# Patient Record
Sex: Male | Born: 1990 | Race: White | Hispanic: No | Marital: Married | Smoking: Never smoker
Health system: Southern US, Community
[De-identification: ages and names within clinical notes are randomized; demographics above are authoritative.]

## PROBLEM LIST (undated history)

## (undated) DIAGNOSIS — Z8619 Personal history of other infectious and parasitic diseases: Secondary | ICD-10-CM

## (undated) DIAGNOSIS — N200 Calculus of kidney: Secondary | ICD-10-CM

## (undated) HISTORY — DX: Personal history of other infectious and parasitic diseases: Z86.19

## (undated) HISTORY — PX: SMALL INTESTINE SURGERY: SHX150

---
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
## 2010-10-07 LAB — URINALYSIS, ROUTINE W REFLEX MICROSCOPIC
Bilirubin Urine: NEGATIVE
Glucose, UA: NEGATIVE mg/dL
Ketones, ur: NEGATIVE mg/dL
Leukocytes, UA: NEGATIVE
Nitrite: NEGATIVE
Protein, ur: NEGATIVE mg/dL
Specific Gravity, Urine: 1.03 (ref 1.005–1.030)
Urobilinogen, UA: 0.2 mg/dL (ref 0.0–1.0)
pH: 5.5 (ref 5.0–8.0)

## 2010-10-07 LAB — URINE MICROSCOPIC-ADD ON

## 2014-07-28 ENCOUNTER — Emergency Department (HOSPITAL_COMMUNITY)
Admission: EM | Admit: 2014-07-28 | Discharge: 2014-07-28 | Disposition: A | Discharge status: Home / Self Care | Payer: BLUE CROSS/BLUE SHIELD | Attending: Emergency Medicine | Admitting: Emergency Medicine

## 2014-07-28 ENCOUNTER — Encounter (HOSPITAL_COMMUNITY): Payer: Self-pay | Admitting: Emergency Medicine

## 2014-07-28 ENCOUNTER — Emergency Department (HOSPITAL_COMMUNITY): Payer: BLUE CROSS/BLUE SHIELD

## 2014-07-28 DIAGNOSIS — Z87442 Personal history of urinary calculi: Secondary | ICD-10-CM | POA: Insufficient documentation

## 2014-07-28 DIAGNOSIS — N23 Unspecified renal colic: Secondary | ICD-10-CM

## 2014-07-28 DIAGNOSIS — R1031 Right lower quadrant pain: Secondary | ICD-10-CM | POA: Diagnosis present

## 2014-07-28 DIAGNOSIS — R109 Unspecified abdominal pain: Secondary | ICD-10-CM

## 2014-07-28 HISTORY — DX: Calculus of kidney: N20.0

## 2014-07-28 LAB — URINALYSIS, ROUTINE W REFLEX MICROSCOPIC
Bilirubin Urine: NEGATIVE
Glucose, UA: NEGATIVE mg/dL
Ketones, ur: NEGATIVE mg/dL
Leukocytes, UA: NEGATIVE
Nitrite: NEGATIVE
Protein, ur: NEGATIVE mg/dL
Specific Gravity, Urine: 1.024 (ref 1.005–1.030)
Urobilinogen, UA: 0.2 mg/dL (ref 0.0–1.0)
pH: 5.5 (ref 5.0–8.0)

## 2014-07-28 LAB — CBC WITH DIFFERENTIAL/PLATELET
Basophils Absolute: 0.1 10*3/uL (ref 0.0–0.1)
Basophils Relative: 1 % (ref 0–1)
Eosinophils Absolute: 0.3 10*3/uL (ref 0.0–0.7)
Eosinophils Relative: 2 % (ref 0–5)
HCT: 44.9 % (ref 39.0–52.0)
Hemoglobin: 15.3 g/dL (ref 13.0–17.0)
Lymphocytes Relative: 37 % (ref 12–46)
Lymphs Abs: 4.1 10*3/uL — ABNORMAL HIGH (ref 0.7–4.0)
MCH: 28.1 pg (ref 26.0–34.0)
MCHC: 34.1 g/dL (ref 30.0–36.0)
MCV: 82.5 fL (ref 78.0–100.0)
Monocytes Absolute: 0.8 10*3/uL (ref 0.1–1.0)
Monocytes Relative: 7 % (ref 3–12)
Neutro Abs: 5.9 10*3/uL (ref 1.7–7.7)
Neutrophils Relative %: 53 % (ref 43–77)
Platelets: 284 10*3/uL (ref 150–400)
RBC: 5.44 MIL/uL (ref 4.22–5.81)
RDW: 13.1 % (ref 11.5–15.5)
WBC: 11.2 10*3/uL — ABNORMAL HIGH (ref 4.0–10.5)

## 2014-07-28 LAB — COMPREHENSIVE METABOLIC PANEL
ALT: 43 U/L (ref 0–53)
AST: 29 U/L (ref 0–37)
Albumin: 4.3 g/dL (ref 3.5–5.2)
Alkaline Phosphatase: 77 U/L (ref 39–117)
Anion gap: 13 (ref 5–15)
BUN: 10 mg/dL (ref 6–23)
CO2: 20 mmol/L (ref 19–32)
Calcium: 9.3 mg/dL (ref 8.4–10.5)
Chloride: 104 mEq/L (ref 96–112)
Creatinine, Ser: 0.99 mg/dL (ref 0.50–1.35)
GFR calc Af Amer: >90 mL/min (ref >90)
GFR calc non Af Amer: >90 mL/min (ref >90)
Glucose, Bld: 104 mg/dL — ABNORMAL HIGH (ref 70–99)
Potassium: 4 mmol/L (ref 3.5–5.1)
Sodium: 137 mmol/L (ref 135–145)
Total Bilirubin: 0.8 mg/dL (ref 0.3–1.2)
Total Protein: 7.1 g/dL (ref 6.0–8.3)

## 2014-07-28 LAB — URINE MICROSCOPIC-ADD ON

## 2014-07-28 LAB — LIPASE, BLOOD: Lipase: 39 U/L (ref 11–59)

## 2014-07-28 MED ORDER — OXYCODONE-ACETAMINOPHEN 5-325 MG PO TABS
2.0000 | ORAL_TABLET | Freq: Once | ORAL | Status: AC
  Administered 2014-07-28: 2 via ORAL
  Filled 2014-07-28: qty 2

## 2014-07-28 MED ORDER — ONDANSETRON HCL 4 MG/2ML IJ SOLN
4.0000 mg | Freq: Once | INTRAMUSCULAR | Status: AC
  Administered 2014-07-28: 4 mg via INTRAVENOUS
  Filled 2014-07-28: qty 2

## 2014-07-28 MED ORDER — TAMSULOSIN HCL 0.4 MG PO CAPS: 0.4000 mg | ORAL_CAPSULE | Freq: Every day | ORAL | Status: DC

## 2014-07-28 MED ORDER — MORPHINE SULFATE 4 MG/ML IJ SOLN
4.0000 mg | Freq: Once | INTRAMUSCULAR | Status: AC
  Administered 2014-07-28: 4 mg via INTRAVENOUS
  Filled 2014-07-28: qty 1

## 2014-07-28 MED ORDER — KETOROLAC TROMETHAMINE 30 MG/ML IJ SOLN
30.0000 mg | Freq: Once | INTRAMUSCULAR | Status: AC
  Administered 2014-07-28: 30 mg via INTRAVENOUS
  Filled 2014-07-28: qty 1

## 2014-07-28 MED ORDER — ONDANSETRON 4 MG PO TBDP: ORAL_TABLET | ORAL | Status: DC

## 2014-07-28 MED ORDER — SODIUM CHLORIDE 0.9 % IV BOLUS (SEPSIS)
1000.0000 mL | Freq: Once | INTRAVENOUS | Status: AC
  Administered 2014-07-28: 1000 mL via INTRAVENOUS

## 2014-07-28 MED ORDER — OXYCODONE-ACETAMINOPHEN 5-325 MG PO TABS: 1.0000 | ORAL_TABLET | Freq: Four times a day (QID) | ORAL | Status: DC | PRN

## 2014-07-28 NOTE — ED Notes (Signed)
Dr. Yao at the bedside. 

## 2014-07-28 NOTE — Discharge Instructions (Signed)
Take motrin 800 mg every 6 hrs for pain.   Take percocet for severe pain. DO NOT drive with it.   Take flomax to help you urinate the stone.   Stay hydrated.   Follow up with urology in a week if you still have pain.   Return to ER if you have severe pain, vomiting, fever.

## 2014-07-28 NOTE — ED Provider Notes (Signed)
CSN: [id number]     Arrival date & time 07/28/14  0413 History   First MD Initiated Contact with Patient 07/28/14 0413     Chief Complaint  Patient presents with  . Flank Pain     (Consider location/radiation/quality/duration/timing/severity/associated sxs/prior Treatment) The history is provided by the patient.  Andrew Ray is a 24 y.o. male hx of kidney stone here with right flank pain, RLQ pain. Woke up this morning with severe right flank pain that is sharp, radiated to RLQ. Denies nausea or vomiting. Didn't take any medications prior to arrival. He said that its similar to previous stone in 2012. Didn't require any surgical interventions for the kidney stone.    Past Medical History  Diagnosis Date  . Kidney stone    History reviewed. No pertinent past surgical history. History reviewed. No pertinent family history. History  Substance Use Topics  . Smoking status: Never Smoker   . Smokeless tobacco: Not on file  . Alcohol Use: No    Review of Systems  Genitourinary: Positive for flank pain.  All other systems reviewed and are negative.     Allergies  Review of patient's allergies indicates no known allergies.  Home Medications   Prior to Admission medications   Not on File   BP 111/66 mmHg  Pulse 74  Temp(Src) 98 F (36.7 C) (Oral)  Resp 16  Ht 5\' 8"  (1.727 m)  Wt 230 lb (104.327 kg)  BMI 34.98 kg/m2  SpO2 99% Physical Exam  Constitutional: He is oriented to person, place, and time.  Uncomfortable   HENT:  Head: Normocephalic.  Mouth/Throat: Oropharynx is clear and moist.  Eyes: Conjunctivae and EOM are normal. Pupils are equal, round, and reactive to light.  Neck: Normal range of motion. Neck supple.  Cardiovascular: Normal rate, regular rhythm and normal heart sounds.   Pulmonary/Chest: Effort normal and breath sounds normal. No respiratory distress. He has no wheezes. He has no rales.  Abdominal: Soft. Bowel sounds are normal. He  exhibits no distension. There is no tenderness. There is no rebound.  Mild R CVAT   Musculoskeletal: Normal range of motion. He exhibits no edema or tenderness.  Neurological: He is alert and oriented to person, place, and time. No cranial nerve deficit. Coordination normal.  Skin: Skin is warm and dry.  Psychiatric: He has a normal mood and affect. His behavior is normal. Judgment and thought content normal.  Nursing note and vitals reviewed.   ED Course  Procedures (including critical care time) Labs Review Labs Reviewed  CBC WITH DIFFERENTIAL - Abnormal; Notable for the following:    WBC 11.2 (*)    Lymphs Abs 4.1 (*)    All other components within normal limits  COMPREHENSIVE METABOLIC PANEL - Abnormal; Notable for the following:    Glucose, Bld 104 (*)    All other components within normal limits  URINALYSIS, ROUTINE W REFLEX MICROSCOPIC - Abnormal; Notable for the following:    APPearance CLOUDY (*)    Hgb urine dipstick LARGE (*)    All other components within normal limits  URINE MICROSCOPIC-ADD ON - Abnormal; Notable for the following:    Bacteria, UA MANY (*)    All other components within normal limits  URINE CULTURE  LIPASE, BLOOD    Imaging Review Ct Renal Stone Study  07/28/2014   CLINICAL DATA:  RIGHT flank and RIGHT lower quadrant pain waking patient from sleep at morning.  EXAM: CT ABDOMEN AND PELVIS WITHOUT CONTRAST  TECHNIQUE:  Multidetector CT imaging of the abdomen and pelvis was performed following the standard protocol without IV contrast.  COMPARISON:  CT of the abdomen and pelvis Dec 08, 2009  FINDINGS: LUNG BASES: Included view of the lung bases are clear. The visualized heart and pericardium are unremarkable.  KIDNEYS/BLADDER: Kidneys are orthotopic, demonstrating normal size and morphology. Mild RIGHT hydroureteronephrosis to the level of the mid ureter where a 3 mm calculus is seen. No residual RIGHT nephrolithiasis. Punctate LEFT lower pole nonobstructing  nephrolithiasis. Limited assessment for renal masses on this nonenhanced examination. The unopacified ureters are normal in course and caliber. Urinary bladder is decompressed and unremarkable.  SOLID ORGANS: The liver, spleen, gallbladder, pancreas and adrenal glands are unremarkable for this non-contrast examination.  GASTROINTESTINAL TRACT: The stomach, small and large bowel are normal in course and caliber without inflammatory changes, the sensitivity may be decreased by lack of enteric contrast. Small amount of small bowel feces can be seen with chronic stasis. The appendix is not discretely identified, however there are no inflammatory changes in the right lower quadrant.  PERITONEUM/RETROPERITONEUM: No intraperitoneal free fluid nor free air. Aortoiliac vessels are normal in course and caliber. No lymphadenopathy by CT size criteria. Internal reproductive organs are unremarkable.  SOFT TISSUES/ OSSEOUS STRUCTURES: Nonsuspicious. Small fat containing inguinal hernias.  IMPRESSION: Mild RIGHT hydroureteronephrosis to the level of the mid ureter where a 3 mm calculus is seen. No residual RIGHT nephrolithiasis.  Punctate nonobstructing LEFT lower pole nephrolithiasis.   Electronically Signed   By: Awilda Metro   On: 07/28/2014 05:08     EKG Interpretation None      MDM   Final diagnoses:  Right flank pain    Andrew Ray is a 24 y.o. male here with R flank pain. Likely renal colic. Will get labs, UA, CT ab/pel.   6:34 AM CT showed 3 mm mid ureteral stone with hydro. UA + blood. No urinary symptoms so urine culture sent. Pain controlled with pain meds. Will dc home with percocet, zofran, flomax. Will have him f/u with urology.     Richardean Canal, MD 07/28/14 [phone]

## 2014-07-28 NOTE — ED Notes (Signed)
Patient reports that he woke up this morning with severe right lower abdominal pain, sharp. Hx of kidney stones approx. 2-3 years ago.  No pain meds prior to arrival, no nausea or vomiting.

## 2014-07-29 LAB — URINE CULTURE: Colony Count: 1000

## 2016-06-24 IMAGING — CT CT RENAL STONE PROTOCOL
2 of 5 series · 8 of 46 positions shown, 9 images · non-contrast
Comparison: CT of the abdomen and pelvis December 08, 2009

CLINICAL DATA: RIGHT flank and RIGHT lower quadrant pain waking
patient from sleep at morning.

EXAM:
CT ABDOMEN AND PELVIS WITHOUT CONTRAST
TECHNIQUE: Multidetector CT imaging of the abdomen and pelvis was performed
following the standard protocol without IV contrast.

[Series 204: coronals · coronal · 0.50mm/px · 3 of 110 slices shown]
[im 37/110  soft-tissue]
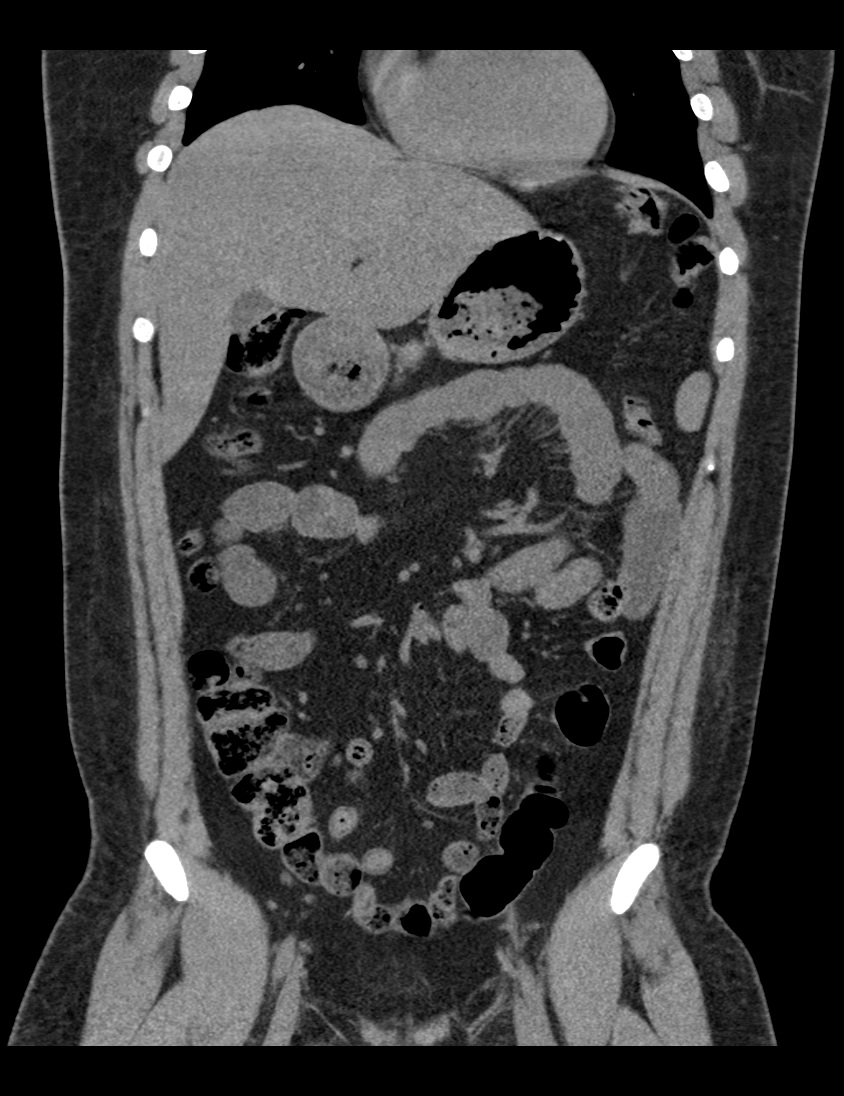
[im 49/110  soft-tissue]
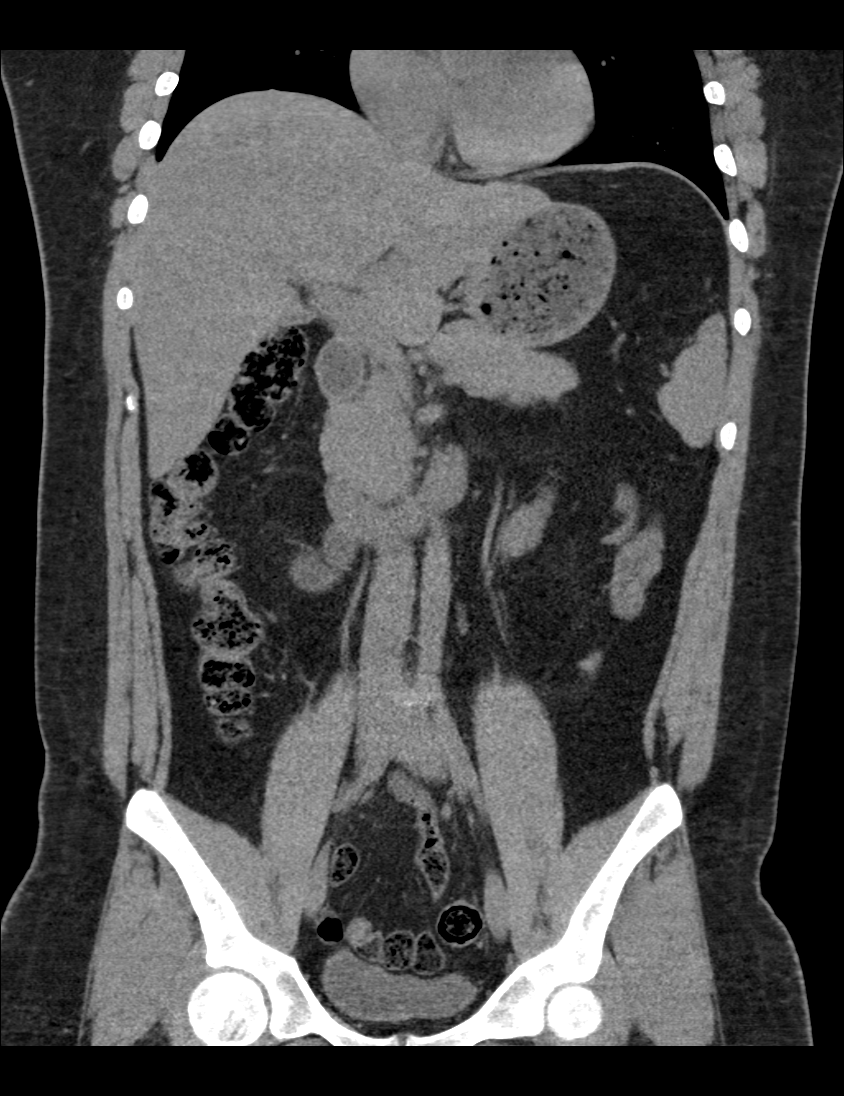
[im 61/110  soft-tissue]
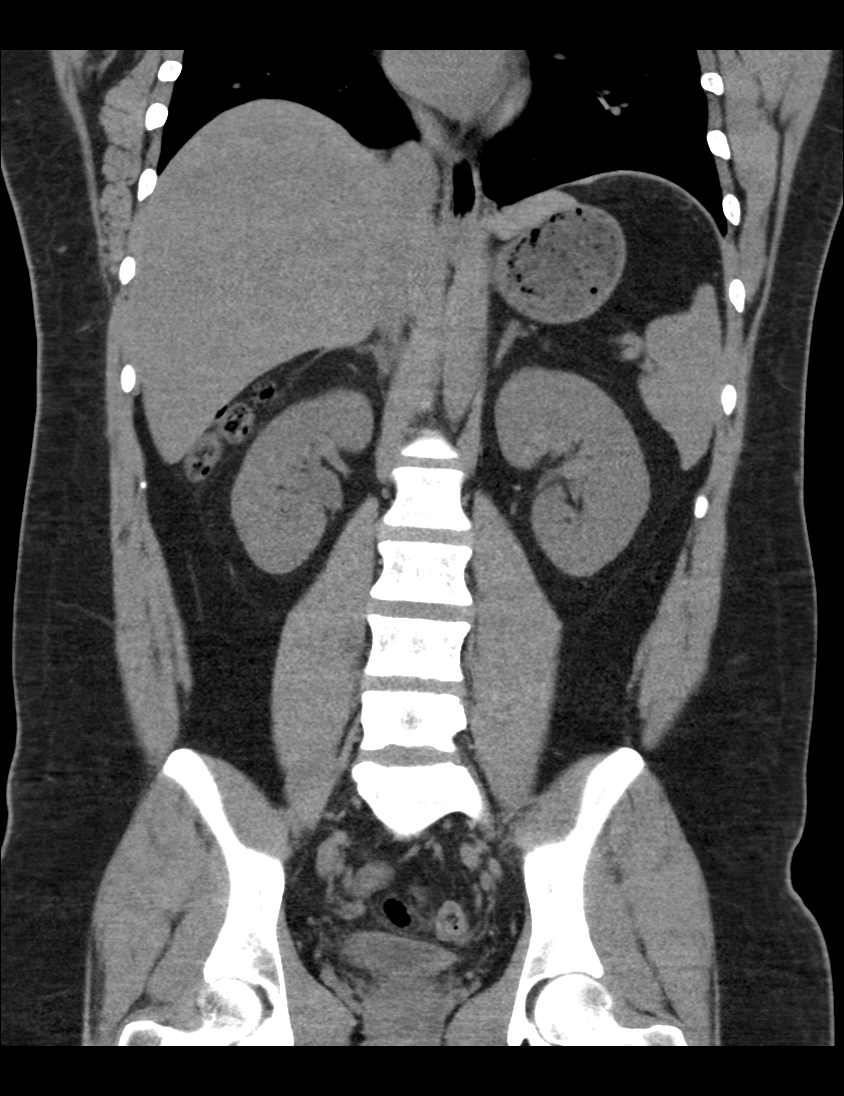

[Series 207: lungs · axial · 0.82mm/px · z∈[+321,+426]mm · 5 of 33 slices shown, 6 images]
[im 6/33  soft-tissue]
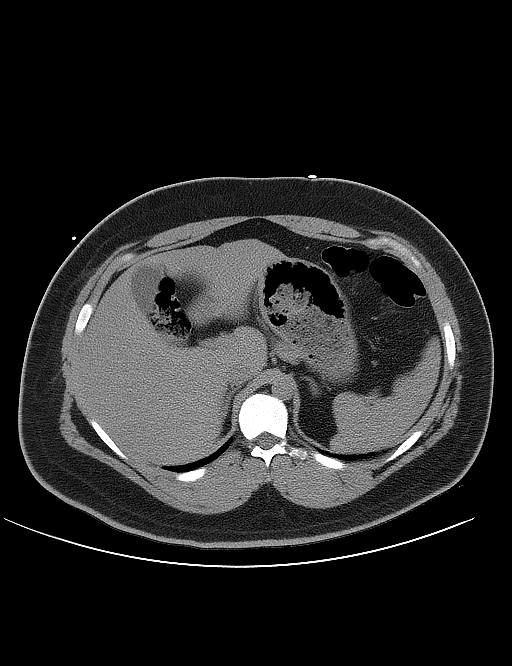
[im 6/33  bone]
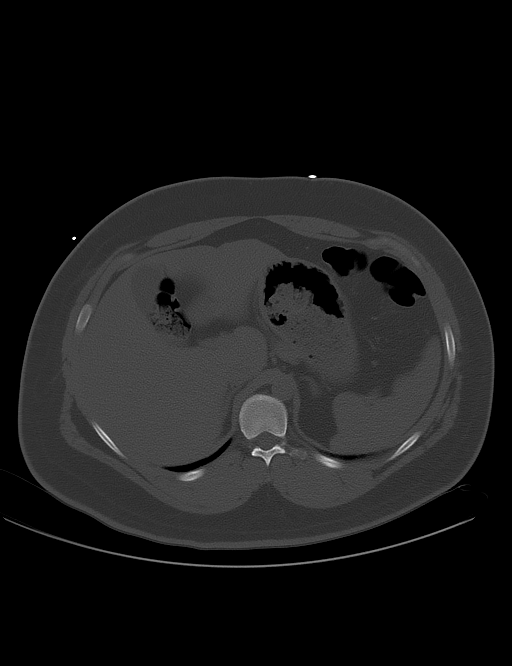
[im 11/33  soft-tissue]
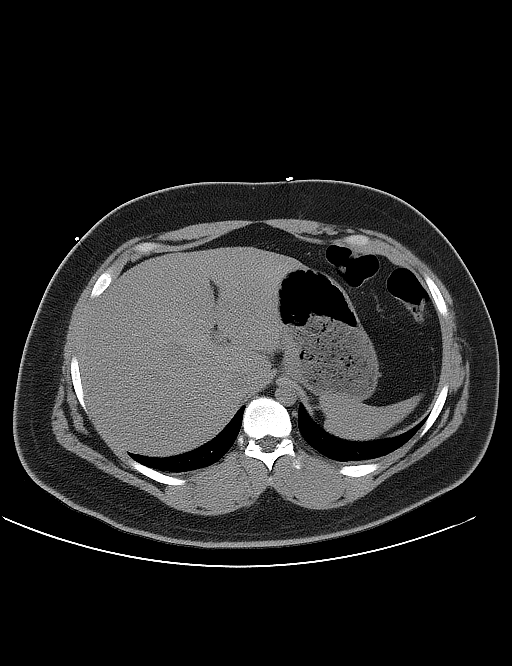
[im 17/33  soft-tissue]
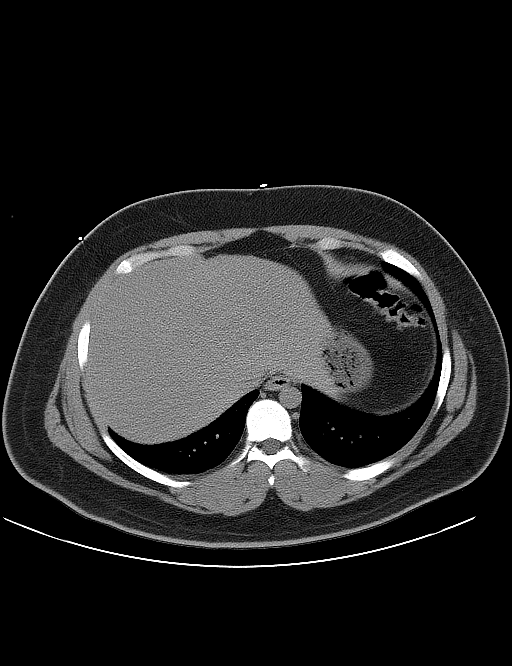
[im 22/33  soft-tissue]
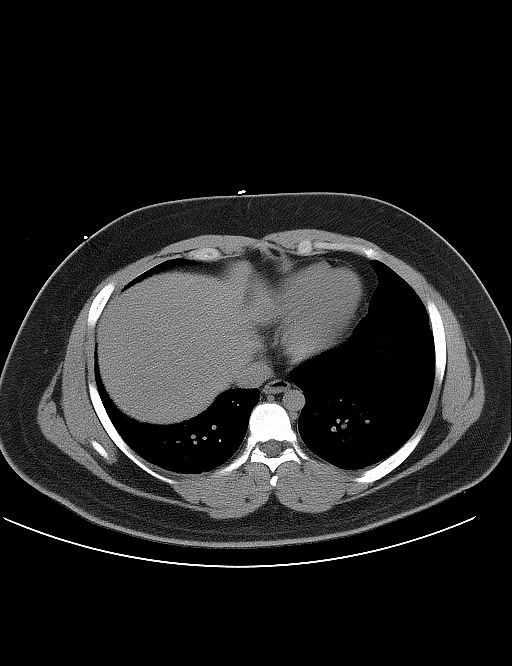
[im 27/33  soft-tissue]
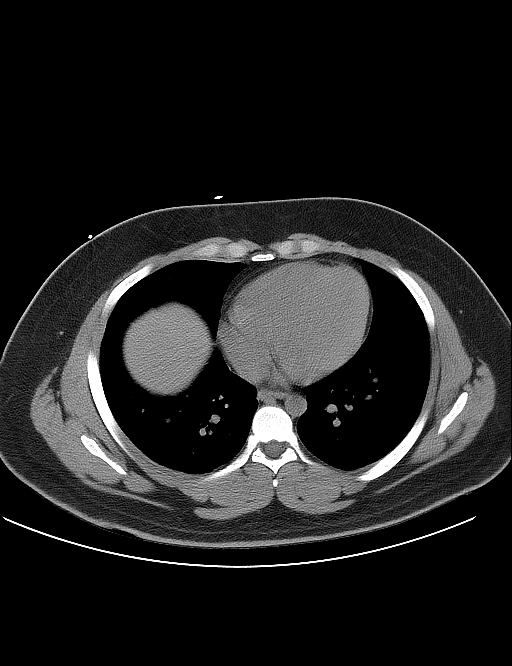

[8 of 46 positions shown; findings below may reference images not displayed]

FINDINGS: LUNG BASES: Included view of the lung bases are clear. The
visualized heart and pericardium are unremarkable.

KIDNEYS/BLADDER: Kidneys are orthotopic, demonstrating normal size
and morphology. Mild RIGHT hydroureteronephrosis to the level of the
mid ureter where a 3 mm calculus is seen. No residual RIGHT
nephrolithiasis. Punctate LEFT lower pole nonobstructing
nephrolithiasis. Limited assessment for renal masses on this
nonenhanced examination. The unopacified ureters are normal in
course and caliber. Urinary bladder is decompressed and
unremarkable.

SOLID ORGANS: The liver, spleen, gallbladder, pancreas and adrenal
glands are unremarkable for this non-contrast examination.

GASTROINTESTINAL TRACT: The stomach, small and large bowel are
normal in course and caliber without inflammatory changes, the
sensitivity may be decreased by lack of enteric contrast. Small
amount of small bowel feces can be seen with chronic stasis. The
appendix is not discretely identified, however there are no
inflammatory changes in the right lower quadrant.

PERITONEUM/RETROPERITONEUM: No intraperitoneal free fluid nor free
air. Aortoiliac vessels are normal in course and caliber. No
lymphadenopathy by CT size criteria. Internal reproductive organs
are unremarkable.

SOFT TISSUES/ OSSEOUS STRUCTURES: Nonsuspicious. Small fat
containing inguinal hernias.
IMPRESSION: Mild RIGHT hydroureteronephrosis to the level of the mid ureter
where a 3 mm calculus is seen. No residual RIGHT nephrolithiasis.

Punctate nonobstructing LEFT lower pole nephrolithiasis.

  By: Davis Jim

## 2016-07-29 ENCOUNTER — Ambulatory Visit (INDEPENDENT_AMBULATORY_CARE_PROVIDER_SITE_OTHER): Payer: 59 | Admitting: Internal Medicine

## 2016-07-29 ENCOUNTER — Encounter: Payer: Self-pay | Admitting: Internal Medicine

## 2016-07-29 VITALS — BP 122/78 | HR 83 | Temp 98.3°F | Ht 69.0 in | Wt 257.0 lb

## 2016-07-29 DIAGNOSIS — K219 Gastro-esophageal reflux disease without esophagitis: Secondary | ICD-10-CM

## 2016-07-29 DIAGNOSIS — Z0001 Encounter for general adult medical examination with abnormal findings: Secondary | ICD-10-CM | POA: Diagnosis not present

## 2016-07-29 DIAGNOSIS — R631 Polydipsia: Secondary | ICD-10-CM | POA: Diagnosis not present

## 2016-07-29 DIAGNOSIS — R350 Frequency of micturition: Secondary | ICD-10-CM | POA: Diagnosis not present

## 2016-07-29 DIAGNOSIS — Z23 Encounter for immunization: Secondary | ICD-10-CM | POA: Diagnosis not present

## 2016-07-29 DIAGNOSIS — Z87442 Personal history of urinary calculi: Secondary | ICD-10-CM

## 2016-07-29 DIAGNOSIS — H538 Other visual disturbances: Secondary | ICD-10-CM | POA: Diagnosis not present

## 2016-07-29 DIAGNOSIS — Z0000 Encounter for general adult medical examination without abnormal findings: Secondary | ICD-10-CM | POA: Diagnosis not present

## 2016-07-29 LAB — COMPREHENSIVE METABOLIC PANEL
ALT: 34 U/L (ref 0–53)
AST: 23 U/L (ref 0–37)
Albumin: 4.5 g/dL (ref 3.5–5.2)
Alkaline Phosphatase: 75 U/L (ref 39–117)
BUN: 12 mg/dL (ref 6–23)
CO2: 27 mEq/L (ref 19–32)
Calcium: 9.8 mg/dL (ref 8.4–10.5)
Chloride: 103 mEq/L (ref 96–112)
Creatinine, Ser: 0.88 mg/dL (ref 0.40–1.50)
GFR: 111.3 mL/min (ref >60.00)
Glucose, Bld: 93 mg/dL (ref 70–99)
Potassium: 3.9 mEq/L (ref 3.5–5.1)
Sodium: 139 mEq/L (ref 135–145)
Total Bilirubin: 0.4 mg/dL (ref 0.2–1.2)
Total Protein: 7.7 g/dL (ref 6.0–8.3)

## 2016-07-29 LAB — LIPID PANEL
Cholesterol: 162 mg/dL (ref 0–200)
HDL: 42.7 mg/dL (ref >39.00)
LDL Cholesterol: 99 mg/dL (ref 0–99)
NonHDL: 119.3
Total CHOL/HDL Ratio: 4
Triglycerides: 101 mg/dL (ref 0.0–149.0)
VLDL: 20.2 mg/dL (ref 0.0–40.0)

## 2016-07-29 LAB — CBC
HCT: 44.3 % (ref 39.0–52.0)
Hemoglobin: 15.1 g/dL (ref 13.0–17.0)
MCHC: 34 g/dL (ref 30.0–36.0)
MCV: 81.3 fl (ref 78.0–100.0)
Platelets: 302 10*3/uL (ref 150.0–400.0)
RBC: 5.45 Mil/uL (ref 4.22–5.81)
RDW: 13.6 % (ref 11.5–15.5)
WBC: 10.6 10*3/uL — ABNORMAL HIGH (ref 4.0–10.5)

## 2016-07-29 LAB — HEMOGLOBIN A1C: Hgb A1c MFr Bld: 5.5 % (ref 4.6–6.5)

## 2016-07-29 NOTE — Progress Notes (Signed)
HPI  Pt presents to the clinic today to establish care and for management of the conditions listed below. He has not had a PCP in many years.  History of kidney stones: This last occurred 1-2 years ago. He is not sure what is causing them.  Flu: never Tetanus: > 10 years ago Dentist: biannually  Diet: He does eat meat. He consumes fruits and veggies daily. He does some fried foods. He drinks mostly water, some soda. Exercise: None  Past Medical History:  Diagnosis Date  . History of chicken pox   . Kidney stone     No current outpatient prescriptions on file.   No current facility-administered medications for this visit.     No Known Allergies  Family History  Problem Relation Age of Onset  . Diabetes Mother   . Hypertension Mother     Social History   Social History  . Marital status: Married    Spouse name: N/A  . Number of children: N/A  . Years of education: N/A   Occupational History  . Not on file.   Social History Main Topics  . Smoking status: Never Smoker  . Smokeless tobacco: Never Used  . Alcohol use No  . Drug use: No  . Sexual activity: Not on file   Other Topics Concern  . Not on file   Social History Narrative  . No narrative on file    ROS:  Constitutional: Pt reports intermittent headaches. Denies fever, malaise, fatigue, or abrupt weight changes.  HEENT: Pt reports blurred vision. Denies eye pain, eye redness, ear pain, ringing in the ears, wax buildup, runny nose, nasal congestion, bloody nose, or sore throat. Respiratory: Denies difficulty breathing, shortness of breath, cough or sputum production.   Cardiovascular: Denies chest pain, chest tightness, palpitations or swelling in the hands or feet.  Gastrointestinal: Pt reports increased thirst, intermittent reflux. Denies abdominal pain, bloating, constipation, diarrhea or blood in the stool.  GU: Pt reports urinary frequency. Denies urgency, pain with urination, blood in urine, odor  or discharge. Musculoskeletal: Denies decrease in range of motion, difficulty with gait, muscle pain or joint pain and swelling.  Skin: Denies redness, rashes, lesions or ulcercations.  Neurological: Denies dizziness, difficulty with memory, difficulty with speech or problems with balance and coordination.  Psych: Denies anxiety, depression, SI/HI.  No other specific complaints in a complete review of systems (except as listed in HPI above).  PE:  BP 122/78   Pulse 83   Temp 98.3 F (36.8 C) (Oral)   Ht 5\' 9"  (1.753 m)   Wt 257 lb (116.6 kg)   SpO2 98%   BMI 37.95 kg/m  Wt Readings from Last 3 Encounters:  07/29/16 257 lb (116.6 kg)  07/28/14 230 lb (104.3 kg)    General: Appears his stated age, obese in NAD. HEENT: Head: normal shape and size; Eyes: sclera white, no icterus, conjunctiva pink, PERRLA and EOMs intact; Ears: Tm's gray and intact, normal light reflex, + effusion on the left;Throat/Mouth: Teeth present, mucosa pink and moist, no lesions or ulcerations noted.  Neck: Neck supple, trachea midline. No masses, lumps or thyromegaly present.  Cardiovascular: Normal rate and rhythm. S1,S2 noted.  No murmur, rubs or gallops noted. No JVD or BLE edema. No carotid bruits noted. Pulmonary/Chest: Normal effort and positive vesicular breath sounds. No respiratory distress. No wheezes, rales or ronchi noted.  Abdomen: Soft and nontender. Normal bowel sounds. No distention or masses noted. Liver, spleen and kidneys non palpable. Musculoskeletal:  Normal range of motion. Strength 5/5 BUE/BLE. No signs of joint swelling. No difficulty with gait.  Neurological: Alert and oriented. Cranial nerves II-XII grossly intact. Coordination normal.  Psychiatric: Mood and affect normal. Behavior is normal. Judgment and thought content normal.    BMET    Component Value Date/Time   NA 137 07/28/2014 0434   K 4.0 07/28/2014 0434   CL 104 07/28/2014 0434   CO2 20 07/28/2014 0434   GLUCOSE 104  (H) 07/28/2014 0434   BUN 10 07/28/2014 0434   CREATININE 0.99 07/28/2014 0434   CALCIUM 9.3 07/28/2014 0434   GFRNONAA >90 07/28/2014 0434   GFRAA >90 07/28/2014 0434    Lipid Panel  No results found for: CHOL, TRIG, HDL, CHOLHDL, VLDL, LDLCALC  CBC    Component Value Date/Time   WBC 11.2 (H) 07/28/2014 0434   RBC 5.44 07/28/2014 0434   HGB 15.3 07/28/2014 0434   HCT 44.9 07/28/2014 0434   PLT 284 07/28/2014 0434   MCV 82.5 07/28/2014 0434   MCH 28.1 07/28/2014 0434   MCHC 34.1 07/28/2014 0434   RDW 13.1 07/28/2014 0434   LYMPHSABS 4.1 (H) 07/28/2014 0434   MONOABS 0.8 07/28/2014 0434   EOSABS 0.3 07/28/2014 0434   BASOSABS 0.1 07/28/2014 0434    Hgb A1C No results found for: HGBA1C   Assessment and Plan:  Preventative Health Maintenance:  Flu shot UTD Tdap today Encouraged him to consume a balanced diet and exercise regimen Advised him to see an dentist at least annually Will check CBC, CMET, Lipid and A1C  Increased thirst, blurred vision, urinary frequency:  Concerning for DM 2 Will check A1C today  GERD:  Try to identify triggers and avoid them Ok to take Tums or Rolaids OTC as needed  History of Kidney Stones:  Will monitor for now RTC in 1 year, sooner if needed. Nicki ReaperBAITY, REGINA, NP

## 2016-07-29 NOTE — Patient Instructions (Signed)

## 2016-08-04 NOTE — Addendum Note (Signed)
Addended by: Roena MaladyEVONTENNO, Caidin Heidenreich Y on: 08/04/2016 05:47 PM   Modules accepted: Orders

## 2017-01-08 ENCOUNTER — Ambulatory Visit (INDEPENDENT_AMBULATORY_CARE_PROVIDER_SITE_OTHER): Payer: BLUE CROSS/BLUE SHIELD | Admitting: Internal Medicine

## 2017-01-08 ENCOUNTER — Encounter: Payer: Self-pay | Admitting: Internal Medicine

## 2017-01-08 VITALS — BP 122/84 | HR 102 | Temp 98.7°F | Wt 257.0 lb

## 2017-01-08 DIAGNOSIS — A084 Viral intestinal infection, unspecified: Secondary | ICD-10-CM

## 2017-01-08 DIAGNOSIS — J329 Chronic sinusitis, unspecified: Secondary | ICD-10-CM | POA: Diagnosis not present

## 2017-01-08 DIAGNOSIS — B9789 Other viral agents as the cause of diseases classified elsewhere: Secondary | ICD-10-CM | POA: Diagnosis not present

## 2017-01-08 NOTE — Progress Notes (Signed)
Subjective:    Patient ID: Fransico MichaelJeffery S Biegel, male    DOB: October 24, 1990, 26 y.o.   MRN: [id number]  HPI  Pt presents to the clinic today with c/o nausea, vomiting and chills. This started 2 days ago. He reports he started vomiting about 4 am yesterday morning. The vomiting has stopped and he woke up this morning with loose stools. He denies blood in his vomit or stools. He denies abdominal pain but has had some cramping. He is not sure if he has had a fever, but he has woke up sweating, had chills and body ached. His appetite is decreased. He has not taken anything OTC for this.  He has not had sick contacts with similar symptoms.  He also reports nasal congestion, ear fullness, sore throat and cough. This started 2 weeks ago. He reports he was blowing yellow mucous out of his nose, but it is clear now. He denies decreased hearing, sore throat. The cough is nonproductive. He is not sure if he has run fevers, but has had sweats, chills and body aches. He has tried Sudafed with minimal relief. He has no history of seasonal allergies and has not had sick contacts.  Review of Systems  Past Medical History:  Diagnosis Date  . History of chicken pox   . Kidney stone     No current outpatient prescriptions on file.   No current facility-administered medications for this visit.     No Known Allergies  Family History  Problem Relation Age of Onset  . Diabetes Mother   . Hypertension Mother   . Diabetes Maternal Grandmother   . Cancer Neg Hx   . Heart disease Neg Hx   . Stroke Neg Hx     Social History   Social History  . Marital status: Married    Spouse name: N/A  . Number of children: N/A  . Years of education: N/A   Occupational History  . Not on file.   Social History Main Topics  . Smoking status: Never Smoker  . Smokeless tobacco: Never Used  . Alcohol use No  . Drug use: No  . Sexual activity: Yes   Other Topics Concern  . Not on file   Social History  Narrative  . No narrative on file     Constitutional: Denies malaise, fatigue, headache or abrupt weight changes.  HEENT: Pt reports nasal congestion,ear fullness, sore throat. Denies eye pain, eye redness, ear pain, ringing in the ears, wax buildup, runny nose, bloody nose. Respiratory: Pt reports cough. Denies difficulty breathing, shortness of breath, or sputum production.   Cardiovascular: Denies chest pain, chest tightness, palpitations or swelling in the hands or feet.  Gastrointestinal: Pt reports nausea, vomiting and diarrhea. Denies abdominal pain, bloating, constipation, or blood in the stool.   No other specific complaints in a complete review of systems (except as listed in HPI above).     Objective:   Physical Exam   BP 122/84   Pulse (!) 102   Temp 98.7 F (37.1 C) (Oral)   Wt 257 lb (116.6 kg)   SpO2 98%   BMI 37.95 kg/m  Wt Readings from Last 3 Encounters:  01/08/17 257 lb (116.6 kg)  07/29/16 257 lb (116.6 kg)  07/28/14 230 lb (104.3 kg)    General: Appears his stated age, in NAD. HEENT: Head: normal shape and size, no sinus tenderness noted; Ears: Tm's pink but intact, normal light reflex, + serous effusion on the left; Nose: mucosa  boggy and moist, septum midline; Throat/Mouth: Teeth present, mucosa pink and moist, + PND, no exudate, lesions or ulcerations noted.  Neck:  No adenopathy noted. Cardiovascular: Tachycardic with normal rhythm.  Pulmonary/Chest: Normal effort and positive vesicular breath sounds. No respiratory distress. No wheezes, rales or ronchi noted.  Abdomen: Soft and nontender. Normal bowel sounds. No distention or masses noted.    BMET    Component Value Date/Time   NA 139 07/29/2016 1018   K 3.9 07/29/2016 1018   CL 103 07/29/2016 1018   CO2 27 07/29/2016 1018   GLUCOSE 93 07/29/2016 1018   BUN 12 07/29/2016 1018   CREATININE 0.88 07/29/2016 1018   CALCIUM 9.8 07/29/2016 1018   GFRNONAA >90 07/28/2014 0434   GFRAA >90  07/28/2014 0434    Lipid Panel     Component Value Date/Time   CHOL 162 07/29/2016 1018   TRIG 101.0 07/29/2016 1018   HDL 42.70 07/29/2016 1018   CHOLHDL 4 07/29/2016 1018   VLDL 20.2 07/29/2016 1018   LDLCALC 99 07/29/2016 1018    CBC    Component Value Date/Time   WBC 10.6 (H) 07/29/2016 1018   RBC 5.45 07/29/2016 1018   HGB 15.1 07/29/2016 1018   HCT 44.3 07/29/2016 1018   PLT 302.0 07/29/2016 1018   MCV 81.3 07/29/2016 1018   MCH 28.1 07/28/2014 0434   MCHC 34.0 07/29/2016 1018   RDW 13.6 07/29/2016 1018   LYMPHSABS 4.1 (H) 07/28/2014 0434   MONOABS 0.8 07/28/2014 0434   EOSABS 0.3 07/28/2014 0434   BASOSABS 0.1 07/28/2014 0434    Hgb A1C Lab Results  Component Value Date   HGBA1C 5.5 07/29/2016           Assessment & Plan:   Viral Sinusitis:  Can try a Neti Pot Advised him to start Zyrtec and Flonase OTC daily x 1-2 weeks  Viral Gastroenteritis:  Consume plenty of fluid and advance to a bland diet when ready Avoid antidiarrheals Discussed the importance of proper handwashing Work note provided  Return precautions discussed Nicki Reaper, NP

## 2017-01-08 NOTE — Patient Instructions (Signed)

## 2017-05-02 ENCOUNTER — Emergency Department (HOSPITAL_COMMUNITY): Payer: BLUE CROSS/BLUE SHIELD

## 2017-05-02 ENCOUNTER — Emergency Department (HOSPITAL_COMMUNITY)
Admission: EM | Admit: 2017-05-02 | Discharge: 2017-05-02 | Disposition: A | Discharge status: Home / Self Care | Payer: BLUE CROSS/BLUE SHIELD | Attending: Emergency Medicine | Admitting: Emergency Medicine

## 2017-05-02 ENCOUNTER — Encounter (HOSPITAL_COMMUNITY): Payer: Self-pay | Admitting: *Deleted

## 2017-05-02 DIAGNOSIS — N200 Calculus of kidney: Secondary | ICD-10-CM | POA: Insufficient documentation

## 2017-05-02 DIAGNOSIS — R112 Nausea with vomiting, unspecified: Secondary | ICD-10-CM | POA: Diagnosis not present

## 2017-05-02 DIAGNOSIS — R1032 Left lower quadrant pain: Secondary | ICD-10-CM | POA: Diagnosis present

## 2017-05-02 LAB — URINALYSIS, ROUTINE W REFLEX MICROSCOPIC
Bilirubin Urine: NEGATIVE
Glucose, UA: NEGATIVE mg/dL
Ketones, ur: NEGATIVE mg/dL
Leukocytes, UA: NEGATIVE
Nitrite: NEGATIVE
Protein, ur: 30 mg/dL — AB
Specific Gravity, Urine: 1.02 (ref 1.005–1.030)
pH: 5 (ref 5.0–8.0)

## 2017-05-02 LAB — BASIC METABOLIC PANEL
Anion gap: 8 (ref 5–15)
BUN: 9 mg/dL (ref 6–20)
CO2: 25 mmol/L (ref 22–32)
Calcium: 9.2 mg/dL (ref 8.9–10.3)
Chloride: 106 mmol/L (ref 101–111)
Creatinine, Ser: 0.99 mg/dL (ref 0.61–1.24)
GFR calc Af Amer: >60 mL/min (ref >60)
GFR calc non Af Amer: >60 mL/min (ref >60)
Glucose, Bld: 97 mg/dL (ref 65–99)
Potassium: 3.8 mmol/L (ref 3.5–5.1)
Sodium: 139 mmol/L (ref 135–145)

## 2017-05-02 MED ORDER — OXYCODONE-ACETAMINOPHEN 5-325 MG PO TABS: 1.0000 | ORAL_TABLET | Freq: Four times a day (QID) | ORAL | 0 refills | Status: DC | PRN

## 2017-05-02 MED ORDER — OXYCODONE-ACETAMINOPHEN 5-325 MG PO TABS
ORAL_TABLET | ORAL | Status: AC
  Filled 2017-05-02: qty 1

## 2017-05-02 MED ORDER — OXYCODONE-ACETAMINOPHEN 5-325 MG PO TABS
1.0000 | ORAL_TABLET | Freq: Once | ORAL | Status: AC
  Administered 2017-05-02: 1 via ORAL

## 2017-05-02 MED ORDER — KETOROLAC TROMETHAMINE 15 MG/ML IJ SOLN: 15.0000 mg | Freq: Once | INTRAMUSCULAR | Status: DC

## 2017-05-02 MED ORDER — OXYCODONE-ACETAMINOPHEN 5-325 MG PO TABS
1.0000 | ORAL_TABLET | ORAL | Status: DC | PRN
  Administered 2017-05-02: 1 via ORAL

## 2017-05-02 MED ORDER — SODIUM CHLORIDE 0.9 % IV BOLUS (SEPSIS): 1000.0000 mL | Freq: Once | INTRAVENOUS | Status: DC

## 2017-05-02 NOTE — Discharge Instructions (Signed)
Take Percocet as needed for pain.  Please strain your urine until you pass the stone, and take this to your PCP or Urologist for further evaluation.  Do not operate heavy machinery while taking narcotic pain medication.  You have been provided with contact information for Urology for follow-up.  Return to the ED for fever, chills, inability to urinate, or symptoms as needed.

## 2017-05-02 NOTE — ED Provider Notes (Signed)
[facility] Provider Note   CSN: [id number] Arrival date & time: 05/02/17  1455   History   Chief Complaint Chief Complaint  Patient presents with  . Back Pain   HPI Andrew Ray is a 26 y.o. male.  The patient is a 26yo male with a medical history significant for nephrolithiasis and GERD who presents to the ED complaining of left flank pain. His pain began this morning and became acutely worse while he was eating lunch this afternoon.  His pain is localized to the left flank. He also reports difficulty urinating, however he denies burning with urination, hematuria, or other urinary symptoms. No fever or chills, however he did have one episode of vomiting earlier today.  The patient states this pain feels similar to kidney stones in the past.  No history of interventions (i.e. stents, lithotripsy, etc.).   The history is provided by the patient and medical records. No language interpreter was used.   Past Medical History:  Diagnosis Date  . History of chicken pox   . Kidney stone    Patient Active Problem List   Diagnosis Date Noted  . Gastroesophageal reflux disease 07/29/2016  . History of kidney stones 07/29/2016   Past Surgical History:  Procedure Laterality Date  . SMALL INTESTINE SURGERY     as a baby    Home Medications    Prior to Admission medications   Not on File   Family History Family History  Problem Relation Age of Onset  . Diabetes Mother   . Hypertension Mother   . Diabetes Maternal Grandmother   . Cancer Neg Hx   . Heart disease Neg Hx   . Stroke Neg Hx    Social History Social History  Substance Use Topics  . Smoking status: Never Smoker  . Smokeless tobacco: Never Used  . Alcohol use No   Allergies   Patient has no known allergies.  Review of Systems Review of Systems  Constitutional: Negative for chills and fever.  Eyes: Negative.   Respiratory: Negative for cough and shortness of breath.   Cardiovascular:  Negative for chest pain.  Gastrointestinal: Positive for nausea and vomiting. Negative for abdominal pain and diarrhea.  Genitourinary: Positive for difficulty urinating and flank pain. Negative for decreased urine volume, dysuria, frequency, hematuria and urgency.  Musculoskeletal: Negative for myalgias.  Skin: Negative for wound.  Allergic/Immunologic: Negative for immunocompromised state.  Neurological: Negative.   Psychiatric/Behavioral: Negative.    Physical Exam Updated Vital Signs BP (!) 141/67 (BP Location: Right Arm)   Pulse 70   Temp 98.3 F (36.8 C) (Oral)   Resp 16   Ht  (1.753 m)   Wt 117.9 kg (260 lb)   SpO2 96%   BMI 38.40 kg/m   Physical Exam  Constitutional: He is oriented to person, place, and time. He appears well-developed and well-nourished.  HENT:  Head: Normocephalic and atraumatic.  Dry mucous membranes  Eyes: Conjunctivae are normal.  Neck: Neck supple.  Cardiovascular: Normal rate, regular rhythm, normal heart sounds and intact distal pulses.   No murmur heard. Pulmonary/Chest: Effort normal and breath sounds normal. No respiratory distress. He has no wheezes.  Abdominal: Soft. He exhibits no mass. There is no tenderness. There is no guarding.  Genitourinary:  Genitourinary Comments: Mild L-sided CVA ttp   Musculoskeletal: He exhibits no edema.  Neurological: He is alert and oriented to person, place, and time.  Skin: Skin is warm and dry. Capillary refill takes less than  2 seconds.  Psychiatric: He has a normal mood and affect. His behavior is normal. Judgment and thought content normal.  Nursing note and vitals reviewed.  ED Treatments / Results  Labs (all labs ordered are listed, but only abnormal results are displayed) Labs Reviewed  URINALYSIS, ROUTINE W REFLEX MICROSCOPIC - Abnormal; Notable for the following:       Result Value   APPearance CLOUDY (*)    Hgb urine dipstick LARGE (*)    Protein, ur 30 (*)    Bacteria, UA FEW (*)      Squamous Epithelial / LPF 0-5 (*)    All other components within normal limits    EKG  EKG Interpretation None      Radiology No results found.  Procedures Procedures (including critical care time)  Medications Ordered in ED Medications  oxyCODONE-acetaminophen (PERCOCET/ROXICET) 5-325 MG per tablet (not administered)  oxyCODONE-acetaminophen (PERCOCET/ROXICET) 5-325 MG per tablet 1 tablet (1 tablet Oral Given 05/02/17 1902)  oxyCODONE-acetaminophen (PERCOCET/ROXICET) 5-325 MG per tablet (not administered)  sodium chloride 0.9 % bolus 1,000 mL (1,000 mLs Intravenous Not Given 05/02/17 2303)  ketorolac (TORADOL) 15 MG/ML injection 15 mg (15 mg Intravenous Not Given 05/02/17 2303)  oxyCODONE-acetaminophen (PERCOCET/ROXICET) 5-325 MG per tablet 1 tablet (1 tablet Oral Given 05/02/17 1505)   Initial Impression / Assessment and Plan / ED Course  I have reviewed the triage vital signs and the nursing notes.  Pertinent labs & imaging results that were available during my care of the patient were reviewed by me and considered in my medical decision making (see chart for details).    Initial differential diagnosis included UTI, kidney stone, pyelonephritis, and  musculoskeletal strain/sprain.  Pertinent labs included UA with few bacteria and protein as well as large blood. BMP unremarkable with normal kidney function.  Imaging studies included a renal ultrasound notable for nephrolithiasis, however no hydronephrosis noted.  The patient was given Percocet for pain and required 2 doses.  Upon reassessment, history and was well-controlled.  I feel the patient can be managed appropriately as an outpatient in the absence of hydronephrosis or a clinical presentation c/w an infected stone. He was given contact information to follow up with urology and also provided with a urine strainer prior to discharge. He was given a prescription for Percocet, and encouraged to use NSAIDs for pain with  Percocet only as needed for severe pain.  I reviewed the [place] Controlled Substances Reporting System prior to providing a prescription for narcotics with no red flags noted on my review.  I discussed the above results with the patient who verbalized understanding.  Return precautions and follow-up plans discussed.  The patient was discharged in stable condition.  Final Clinical Impressions(s) / ED Diagnoses   Final diagnoses:  Kidney stone   New Prescriptions New Prescriptions   No medications on file     Levester Fresh, MD 05/03/17 0133    Gerhard Munch, MD 05/03/17 [phone]

## 2017-05-02 NOTE — ED Notes (Signed)
Strainer and instructions given to patient.

## 2017-05-02 NOTE — ED Triage Notes (Signed)
Pt reports onset last night of left side lower back pain. Hx of kidney stones. Denies urinary symptoms.

## 2017-05-02 NOTE — ED Notes (Signed)
Pt states unable to obtain urine sample at this time. 

## 2017-05-02 NOTE — ED Notes (Signed)
Patient transported to Ultrasound 

## 2018-08-06 IMAGING — US US RENAL
1 series · 14 of 25 positions shown · non-contrast
Comparison: None.

CLINICAL DATA: Left-sided back pain, onset last night

EXAM:
RENAL / URINARY TRACT ULTRASOUND COMPLETE

[Series 1: us renal · 0.26mm/px · 14 of 40 slices shown]
[im 1/40]
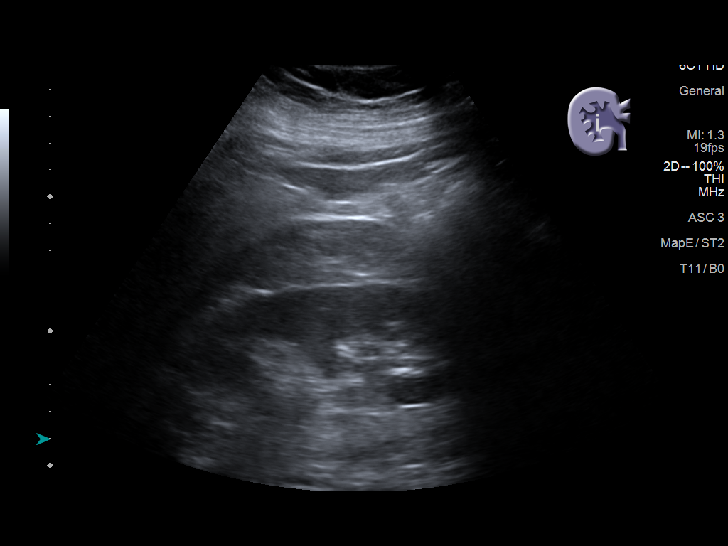
[im 4/40]
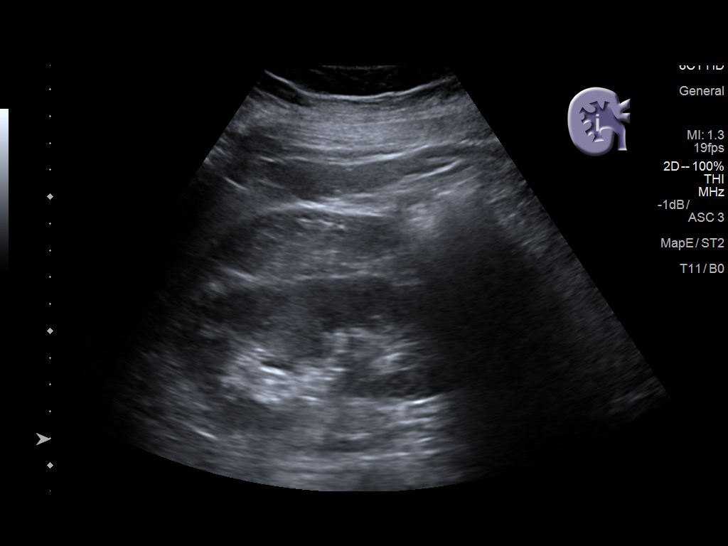
[im 7/40]
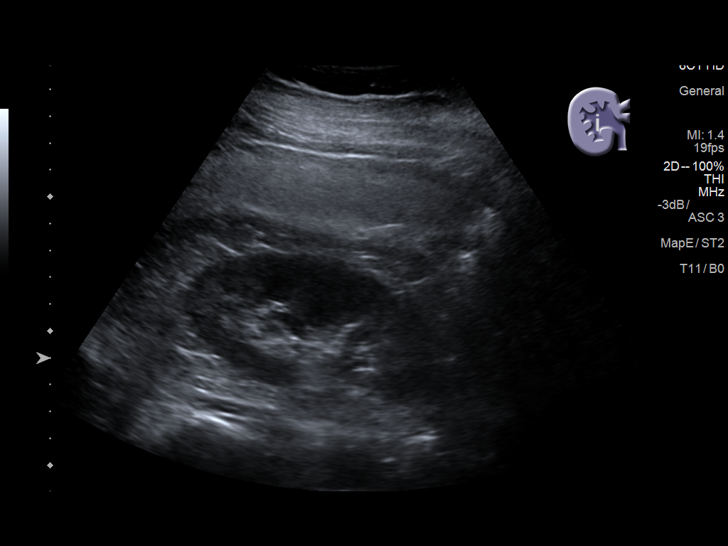
[im 10/40]
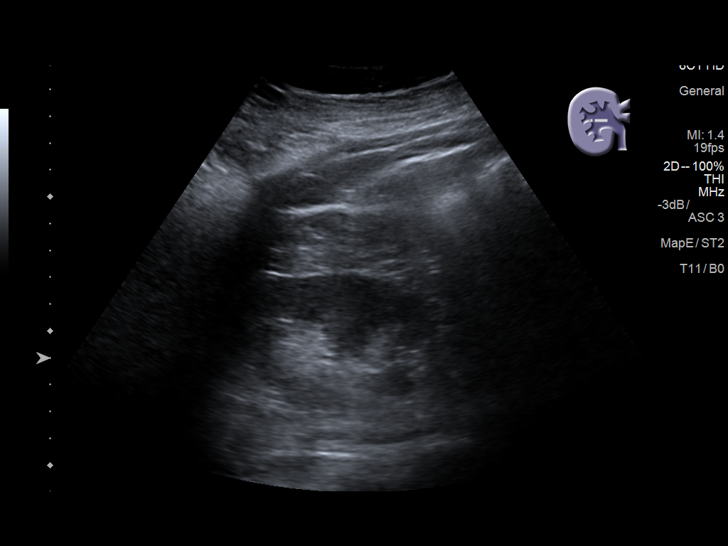
[im 14/40]
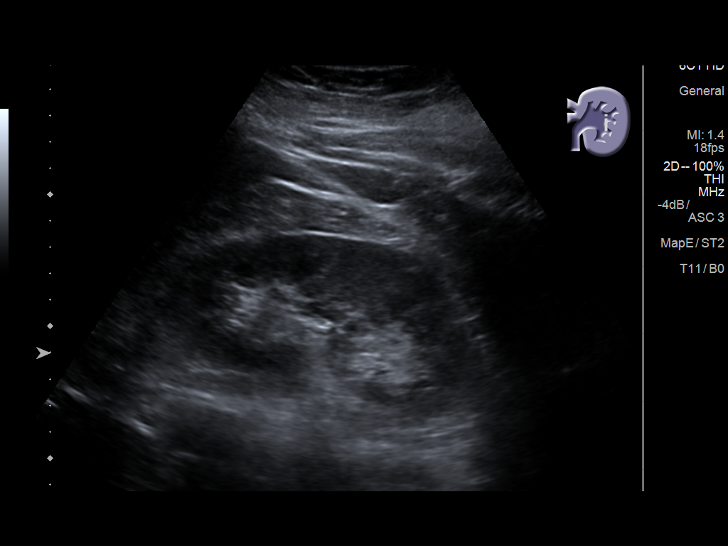
[im 15/40]
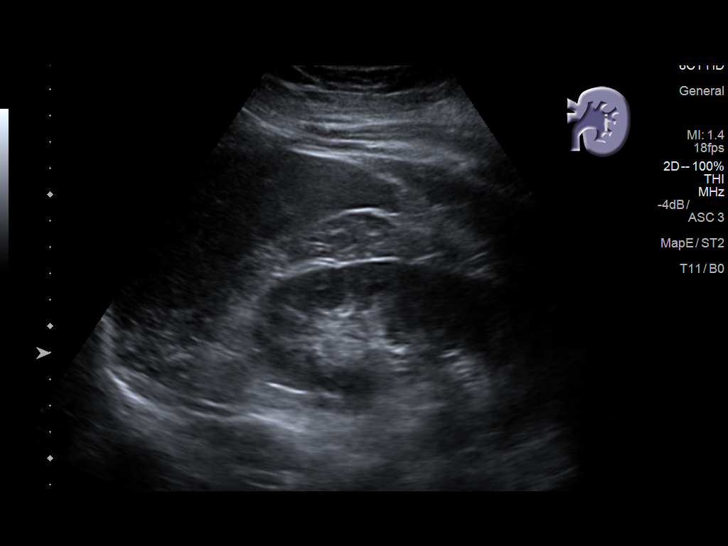
[im 18/40]
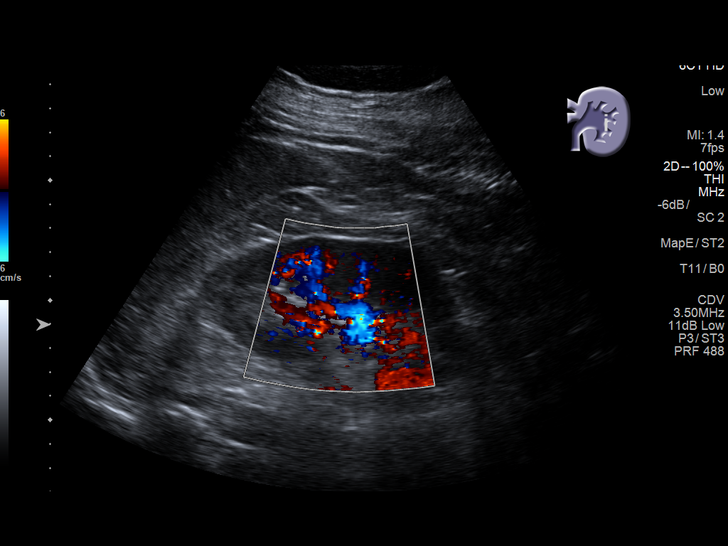
[im 22/40]
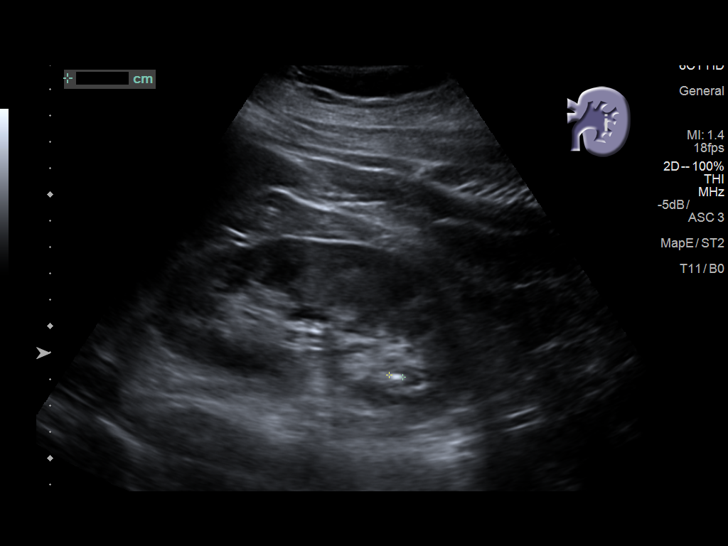
[im 25/40]
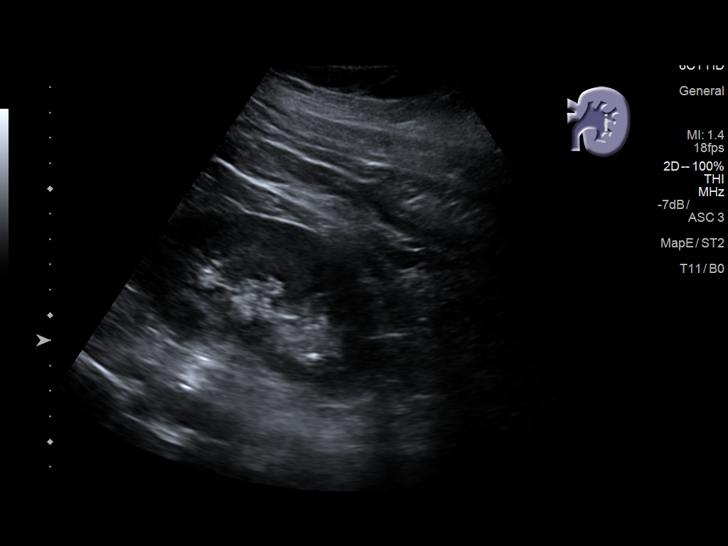
[im 27/40]
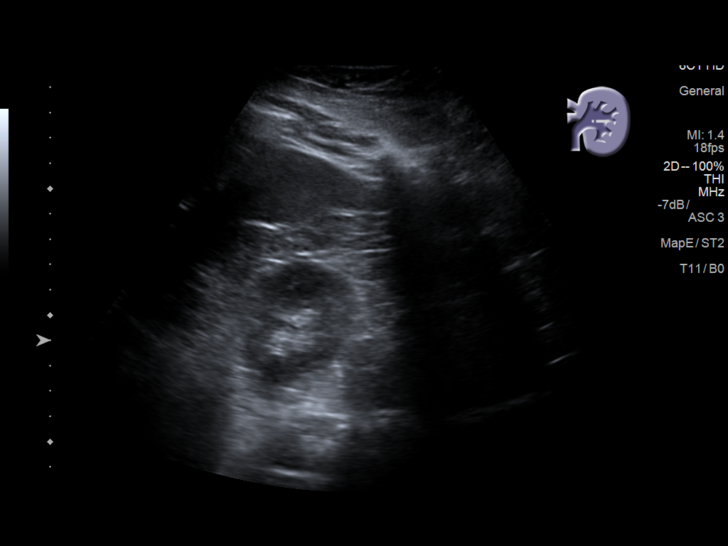
[im 30/40]
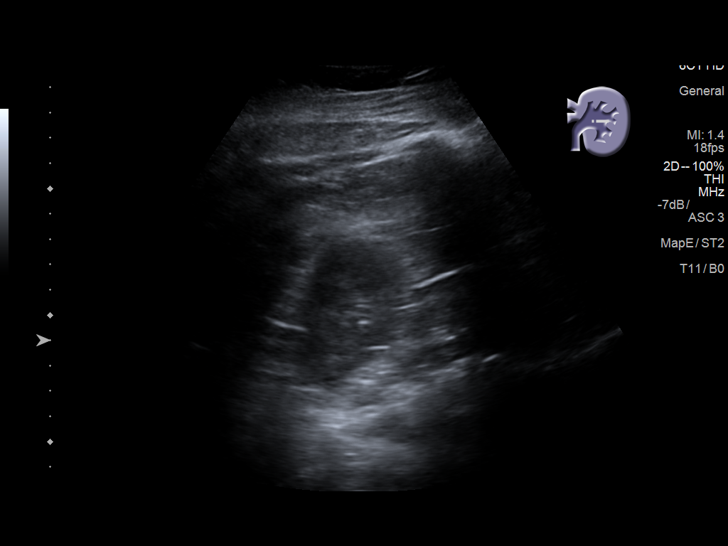
[im 33/40]
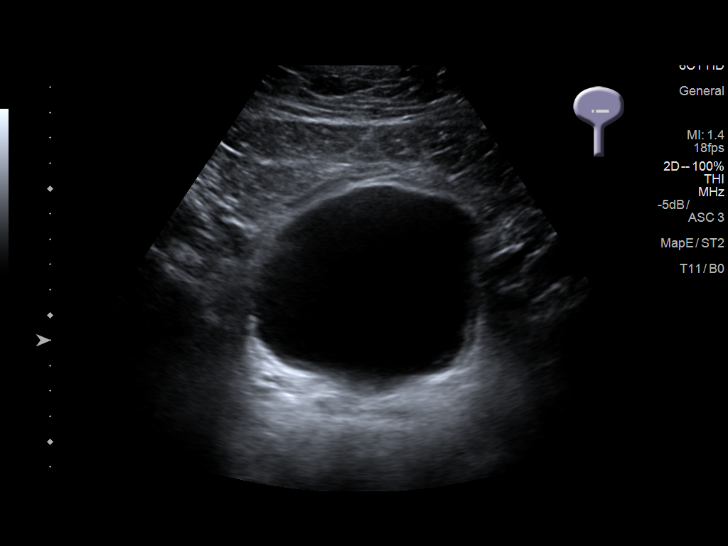
[im 36/40]
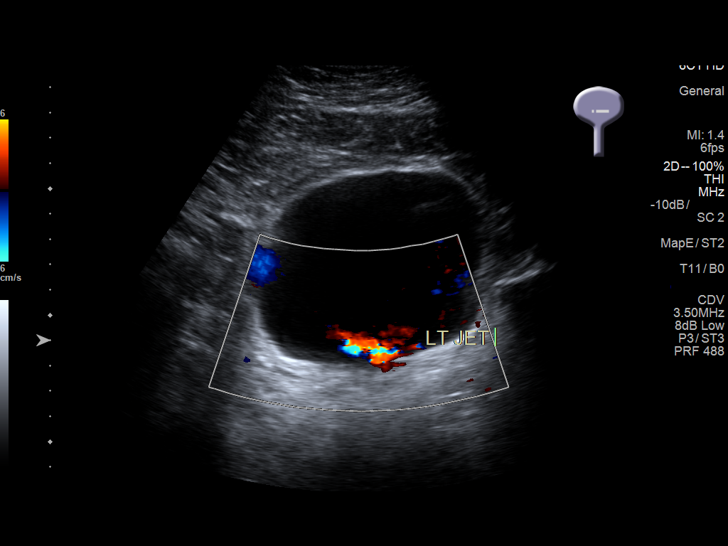
[im 40/40]
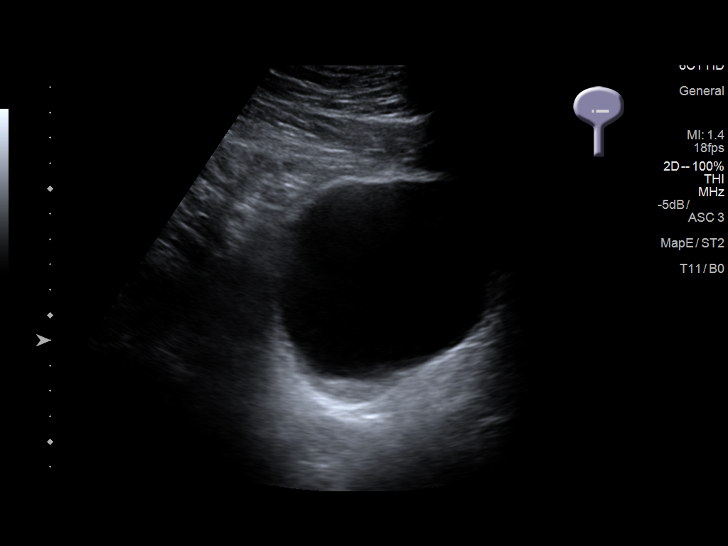

[14 of 25 positions shown; findings below may reference images not displayed]

FINDINGS: Right Kidney:

Length: 13.5 cm. Echogenicity within normal limits. No mass or
hydronephrosis visualized.

Left Kidney:

Length: 12.5 cm. Normal parenchymal thickness and echogenicity. No
hydronephrosis. Lower pole collecting system calculi measuring up to
5 mm.

Bladder:

Unremarkable appearances of the urinary bladder. Ureteral jets were
documented bilaterally.
IMPRESSION: Left nephrolithiasis.  No hydronephrosis.

## 2020-11-22 ENCOUNTER — Other Ambulatory Visit: Payer: Self-pay

## 2020-11-22 ENCOUNTER — Telehealth (INDEPENDENT_AMBULATORY_CARE_PROVIDER_SITE_OTHER): Payer: BC Managed Care – PPO | Admitting: Family Medicine

## 2020-11-22 ENCOUNTER — Encounter: Payer: Self-pay | Admitting: Family Medicine

## 2020-11-22 VITALS — Ht 70.0 in | Wt 280.0 lb

## 2020-11-22 DIAGNOSIS — H65192 Other acute nonsuppurative otitis media, left ear: Secondary | ICD-10-CM

## 2020-11-22 DIAGNOSIS — J0100 Acute maxillary sinusitis, unspecified: Secondary | ICD-10-CM

## 2020-11-22 MED ORDER — AMOXICILLIN-POT CLAVULANATE 875-125 MG PO TABS: 1.0000 | ORAL_TABLET | Freq: Two times a day (BID) | ORAL | 0 refills | Status: DC

## 2020-11-22 NOTE — Progress Notes (Signed)
I connected with Andrew Ray on 11/22/20 at  3:40 PM EDT by video and verified that I am speaking with the correct person using two identifiers.   I discussed the limitations, risks, security and privacy concerns of performing an evaluation and management service by video and the availability of in person appointments. I also discussed with the patient that there may be a patient responsible charge related to this service. The patient expressed understanding and agreed to proceed.  Patient location: work Provider Location: [facility] Participants: Lynnda Child and Tessa Lerner [place]   Subjective:     Andrew Ray is a 30 y.o. male presenting for Nasal Congestion, Cough, and Ear Fullness (L)     Sinus Problem This is a new problem. The current episode started 1 to 4 weeks ago. There has been no fever. Associated symptoms include coughing, ear pain (2 days ago, left ear) and sinus pressure. Pertinent negatives include no chills, shortness of breath or sore throat. Past treatments include oral decongestants (ibuprofen). The treatment provided mild relief.     Review of Systems  Constitutional: Negative for chills.  HENT: Positive for ear pain (2 days ago, left ear) and sinus pressure. Negative for sore throat.   Respiratory: Positive for cough. Negative for shortness of breath.      Social History   Tobacco Use  Smoking Status Never Smoker  Smokeless Tobacco Never Used        Objective:   BP Readings from Last 3 Encounters:  05/02/17 124/67  01/08/17 122/84  07/29/16 122/78   Wt Readings from Last 3 Encounters:  11/22/20 280 lb (127 kg)  05/02/17 260 lb (117.9 kg)  01/08/17 257 lb (116.6 kg)    Ht 5\' 10"  (1.778 m)   Wt 280 lb (127 kg)   BMI 40.18 kg/m     Physical Exam Constitutional:      Appearance: Normal appearance. He is not ill-appearing or diaphoretic.  HENT:     Head: Normocephalic and atraumatic.      Right Ear: External ear normal.     Left Ear: External ear normal.     Nose: Nose normal.  Eyes:     General: No scleral icterus.    Extraocular Movements: Extraocular movements intact.     Conjunctiva/sclera: Conjunctivae normal.  Cardiovascular:     Rate and Rhythm: Normal rate.  Pulmonary:     Effort: Pulmonary effort is normal. No respiratory distress.  Musculoskeletal:     Cervical back: Neck supple.  Skin:    General: Skin is warm and dry.  Neurological:     Mental Status: He is alert. Mental status is at baseline.  Psychiatric:        Mood and Affect: Mood normal.        Behavior: Behavior normal.        Thought Content: Thought content normal.        Judgment: Judgment normal.           Assessment & Plan:   Problem List Items Addressed This Visit   None   Visit Diagnoses    Acute non-recurrent maxillary sinusitis    -  Primary   Relevant Medications   Phenylephrine-APAP-guaiFENesin (MUCINEX SINUS-MAX SEV CONG/PN) 5-325-200 MG TABS   amoxicillin-clavulanate (AUGMENTIN) 875-125 MG tablet   Other non-recurrent acute nonsuppurative otitis media of left ear       Relevant Medications   amoxicillin-clavulanate (AUGMENTIN) 875-125 MG tablet  Pt with ear pain so discussed 10 day course for possible infection.   Also advised returning for annual visit as has not been seen in a few years.   Return if symptoms worsen or fail to improve.  Lynnda Child, MD

## 2021-01-26 ENCOUNTER — Encounter (HOSPITAL_COMMUNITY): Payer: Self-pay

## 2021-01-26 ENCOUNTER — Ambulatory Visit (HOSPITAL_COMMUNITY)
Admission: EM | Admit: 2021-01-26 | Discharge: 2021-01-26 | Disposition: A | Discharge status: Home / Self Care | Payer: BC Managed Care – PPO | Attending: Emergency Medicine | Admitting: Emergency Medicine

## 2021-01-26 DIAGNOSIS — L739 Follicular disorder, unspecified: Secondary | ICD-10-CM | POA: Diagnosis not present

## 2021-01-26 MED ORDER — DOXYCYCLINE HYCLATE 100 MG PO CAPS: 100.0000 mg | ORAL_CAPSULE | Freq: Two times a day (BID) | ORAL | 0 refills | Status: DC

## 2021-01-26 NOTE — Discharge Instructions (Addendum)
Take the doxycycline 1 pill twice a day for the next 10 days.  Apply a warm compress to the areas 3-4 times a day. You can take Tylenol and/or ibuprofen as needed for pain relief and fever reduction.  Return for reevaluation for any worsening symptoms including worsening redness, swelling, red streaks, or fevers.  

## 2021-01-26 NOTE — ED Triage Notes (Signed)
Opt presents with infected wound area on tattoo on left arm X 2 weeks; pt also had wound area on right forearm that red and tender to touch.

## 2021-01-26 NOTE — ED Provider Notes (Signed)
[facility]    CSN: [id number] Arrival date & time: 01/26/21  1438      History   Chief Complaint Chief Complaint  Patient presents with   Wound Infection    HPI Andrew Ray is a 30 y.o. male.   Patient here for evaluation of possible infection on new tattoo.  Reports having tattoo work done on left upper arm approximately 2 weeks ago but developed 2 small draining wounds in the last few days.  Also noticed a small draining wound on his right forearm yesterday.  Reports wounds are tender to touch.  Has not taken any OTC medications or treatments.  Denies any trauma, injury, or other precipitating event.  Denies any specific alleviating or aggravating factors.  Denies any fevers, chest pain, shortness of breath, N/V/D, numbness, tingling, weakness, abdominal pain, or headaches.     The history is provided by the patient.   Past Medical History:  Diagnosis Date   History of chicken pox    Kidney stone     Patient Active Problem List   Diagnosis Date Noted   Gastroesophageal reflux disease 07/29/2016   History of kidney stones 07/29/2016    Past Surgical History:  Procedure Laterality Date   SMALL INTESTINE SURGERY     as a baby       Home Medications    Prior to Admission medications   Medication Sig Start Date End Date Taking? Authorizing Provider  doxycycline (VIBRAMYCIN) 100 MG capsule Take 1 capsule (100 mg total) by mouth 2 (two) times daily. 01/26/21  Yes Ivette Loyal, NP  Phenylephrine-APAP-guaiFENesin (MUCINEX SINUS-MAX SEV CONG/PN) 5-325-200 MG TABS Take by mouth as needed.    [provider]    Family History Family History  Problem Relation Age of Onset   Diabetes Mother    Hypertension Mother    Diabetes Maternal Grandmother    Cancer Neg Hx    Heart disease Neg Hx    Stroke Neg Hx     Social History Social History   Tobacco Use   Smoking status: Never   Smokeless tobacco: Never  Substance Use Topics    Alcohol use: No   Drug use: No     Allergies   Patient has no known allergies.   Review of Systems Review of Systems  Skin:  Positive for wound.  All other systems reviewed and are negative.   Physical Exam Triage Vital Signs ED Triage Vitals  Enc Vitals Group     BP 01/26/21 1548 (!) 146/76     Pulse Rate 01/26/21 1548 79     Resp 01/26/21 1548 18     Temp 01/26/21 1548 98 F (36.7 C)     Temp Source 01/26/21 1548 Oral     SpO2 01/26/21 1548 99 %     Weight --      Height --      Head Circumference --      Peak Flow --      Pain Score 01/26/21 1550 4     Pain Loc --      Pain Edu? --      Excl. in GC? --    No data found.  Updated Vital Signs BP (!) 146/76 (BP Location: Left Arm)   Pulse 79   Temp 98 F (36.7 C) (Oral)   Resp 18   SpO2 99%   Visual Acuity Right Eye Distance:   Left Eye Distance:   Bilateral Distance:  Right Eye Near:   Left Eye Near:    Bilateral Near:     Physical Exam Vitals and nursing note reviewed.  Constitutional:      General: He is not in acute distress.    Appearance: Normal appearance. He is not ill-appearing, toxic-appearing or diaphoretic.  HENT:     Head: Normocephalic and atraumatic.  Eyes:     Conjunctiva/sclera: Conjunctivae normal.  Cardiovascular:     Rate and Rhythm: Normal rate.     Pulses: Normal pulses.  Pulmonary:     Effort: Pulmonary effort is normal.  Abdominal:     General: Abdomen is flat.  Musculoskeletal:        General: Normal range of motion.     Cervical back: Normal range of motion.  Skin:    General: Skin is warm and dry.     Findings: Abscess (2 abscess approximately 1cm in diameter to the left shoulder and 1 to the right forearm with purulent drainage and induration) present.  Neurological:     General: No focal deficit present.     Mental Status: He is alert and oriented to person, place, and time.  Psychiatric:        Mood and Affect: Mood normal.     UC Treatments / Results   Labs (all labs ordered are listed, but only abnormal results are displayed) Labs Reviewed - No data to display  EKG   Radiology No results found.  Procedures Procedures (including critical care time)  Medications Ordered in UC Medications - No data to display  Initial Impression / Assessment and Plan / UC Course  I have reviewed the triage vital signs and the nursing notes.  Pertinent labs & imaging results that were available during my care of the patient were reviewed by me and considered in my medical decision making (see chart for details).    Assessment negative for likely folliculitis.  We will treat with doxycycline for next 10 days.  May apply warm compresses 3-4 times a day.  Tylenol and/or ibuprofen as needed for pain and fevers.  Strict ED follow-up for any worsening signs of infection including worsening redness, swelling, red streaks, or fevers.  Otherwise follow-up with primary care as needed. Final Clinical Impressions(s) / UC Diagnoses   Final diagnoses:  Folliculitis     Discharge Instructions      Take the doxycycline 1 pill twice a day for the next 10 days.  Apply a warm compress to the areas 3-4 times a day.  You can take Tylenol and/or ibuprofen as needed for pain relief and fever reduction.  Return for reevaluation for any worsening symptoms including worsening redness, swelling, red streaks, or fevers.     ED Prescriptions     Medication Sig Dispense Auth. Provider   doxycycline (VIBRAMYCIN) 100 MG capsule Take 1 capsule (100 mg total) by mouth 2 (two) times daily. 20 capsule Ivette Loyal, NP      PDMP not reviewed this encounter.   Ivette Loyal, NP 01/26/21 [phone]

## 2021-01-28 ENCOUNTER — Telehealth: Payer: Self-pay | Admitting: *Deleted

## 2021-01-28 ENCOUNTER — Emergency Department (HOSPITAL_COMMUNITY)
Admission: EM | Admit: 2021-01-28 | Discharge: 2021-01-29 | Disposition: A | Discharge status: Home / Self Care | Payer: BC Managed Care – PPO | Attending: Emergency Medicine | Admitting: Emergency Medicine

## 2021-01-28 DIAGNOSIS — L0291 Cutaneous abscess, unspecified: Secondary | ICD-10-CM

## 2021-01-28 DIAGNOSIS — L02413 Cutaneous abscess of right upper limb: Secondary | ICD-10-CM | POA: Diagnosis not present

## 2021-01-28 DIAGNOSIS — L02414 Cutaneous abscess of left upper limb: Secondary | ICD-10-CM | POA: Insufficient documentation

## 2021-01-28 LAB — CBC WITH DIFFERENTIAL/PLATELET
Abs Immature Granulocytes: 0.06 10*3/uL (ref 0.00–0.07)
Basophils Absolute: 0.1 10*3/uL (ref 0.0–0.1)
Basophils Relative: 1 %
Eosinophils Absolute: 0.1 10*3/uL (ref 0.0–0.5)
Eosinophils Relative: 1 %
HCT: 45.8 % (ref 39.0–52.0)
Hemoglobin: 14.6 g/dL (ref 13.0–17.0)
Immature Granulocytes: 0 %
Lymphocytes Relative: 27 %
Lymphs Abs: 3.8 10*3/uL (ref 0.7–4.0)
MCH: 27 pg (ref 26.0–34.0)
MCHC: 31.9 g/dL (ref 30.0–36.0)
MCV: 84.7 fL (ref 80.0–100.0)
Monocytes Absolute: 0.7 10*3/uL (ref 0.1–1.0)
Monocytes Relative: 5 %
Neutro Abs: 9.6 10*3/uL — ABNORMAL HIGH (ref 1.7–7.7)
Neutrophils Relative %: 66 %
Platelets: 344 10*3/uL (ref 150–400)
RBC: 5.41 MIL/uL (ref 4.22–5.81)
RDW: 12.9 % (ref 11.5–15.5)
WBC: 14.4 10*3/uL — ABNORMAL HIGH (ref 4.0–10.5)
nRBC: 0 % (ref 0.0–0.2)

## 2021-01-28 LAB — COMPREHENSIVE METABOLIC PANEL
ALT: 30 U/L (ref 0–44)
AST: 20 U/L (ref 15–41)
Albumin: 4 g/dL (ref 3.5–5.0)
Alkaline Phosphatase: 67 U/L (ref 38–126)
Anion gap: 9 (ref 5–15)
BUN: 9 mg/dL (ref 6–20)
CO2: 27 mmol/L (ref 22–32)
Calcium: 9.6 mg/dL (ref 8.9–10.3)
Chloride: 102 mmol/L (ref 98–111)
Creatinine, Ser: 0.92 mg/dL (ref 0.61–1.24)
GFR, Estimated: >60 mL/min (ref >60)
Glucose, Bld: 79 mg/dL (ref 70–99)
Potassium: 3.6 mmol/L (ref 3.5–5.1)
Sodium: 138 mmol/L (ref 135–145)
Total Bilirubin: 1 mg/dL (ref 0.3–1.2)
Total Protein: 7.6 g/dL (ref 6.5–8.1)

## 2021-01-28 NOTE — Telephone Encounter (Signed)
Patient called stating that he has a skin rash and went to [facility] ER Saturday. Patient stated that he was put on an antibiotic and the infection is not improving. Patient stated that the infection seems to be getting worse. Patient denies a fever but stated the infected area is warm to the touch. Patient stated that he thinks that someone else needs to look at the area that is infected. Patient scheduled for an appointment with Dr. Ermalene Searing 01/29/21 at 10:40 am. Patient was given ER precautions and he verbalized understanding. Patient stated that he is aware that his PCP has left this office but he does plan on staying at [facility].

## 2021-01-28 NOTE — ED Provider Notes (Signed)
Emergency Medicine Provider Triage Evaluation Note  Andrew Ray , a 30 y.o. male  was evaluated in triage.  Pt complains of abscesses to his bilateral arms. Pt had a new tattoo done on his left upper arm about 2 weeks ago. He then developed two abscess in the region. One drained and healed and the other has continued to worsen. He reports significant pain in the region. He then developed another abscess to the right forearm about 6 days ago. He states it has drained intermittently. He was seen at UC 2 days ago and started on doxycycline which he has been taking BID and notes his sx have continued to worsen. No fevers but does note an episode of emesis this AM. Denies any h/o abscesses. No IVDA.  Physical Exam  BP 133/88 (BP Location: Left Arm)   Pulse 92   Temp 98.8 F (37.1 C)   Resp 16   SpO2 100%  Gen:   Awake, no distress   Resp:  Normal effort  MSK:   Moves extremities without difficulty  Other:  4 cm region of induration to the left upper arm. No palpable fluctuance. Difficult to assess due to the area being tattooed extensively. Right forearm with an erythematous 4-5 cm fluctuant region with a central pustule.  Medical Decision Making  Medically screening exam initiated at 8:36 PM.  Appropriate orders placed.  Andrew Ray was informed that the remainder of the evaluation will be completed by another provider, this initial triage assessment does not replace that evaluation, and the importance of remaining in the ED until their evaluation is complete.   Andrew Sou, PA-C 01/28/21 2040    Andrew Loll, MD 01/29/21 1531

## 2021-01-28 NOTE — ED Triage Notes (Signed)
Pt c/o abscesses to bilateral arms. L arm tattoo done 2wks ago, abscesses noted 1wk after, since healed. Small abscess R forearm x4 days. Seen at [facility] for same, advised to come for eval

## 2021-01-29 ENCOUNTER — Telehealth: Payer: Self-pay

## 2021-01-29 ENCOUNTER — Ambulatory Visit: Payer: BC Managed Care – PPO | Admitting: Family Medicine

## 2021-01-29 MED ORDER — LIDOCAINE-EPINEPHRINE (PF) 2 %-1:200000 IJ SOLN
10.0000 mL | Freq: Once | INTRAMUSCULAR | Status: AC
  Administered 2021-01-29: 10 mL
  Filled 2021-01-29: qty 20

## 2021-01-29 MED ORDER — HIBICLENS 4 % EX LIQD: Freq: Every day | CUTANEOUS | 0 refills | Status: AC | PRN

## 2021-01-29 NOTE — Telephone Encounter (Signed)
Pt was not seen today.. not on my schedule. Call to determine if he needs follow up... reschedule with another provider as no current PCP and I am not back in office until 02/01/2021.

## 2021-01-29 NOTE — ED Provider Notes (Signed)
[facility] EMERGENCY DEPARTMENT Provider Note   CSN: [id number] Arrival date & time: 01/28/21  1736     History Chief Complaint  Patient presents with   Abscess    Andrew Ray is a 30 y.o. male.  Patient presents to the emergency department with a chief complaint of abscess.  He states that he has an abscess on his right forearm and also had an indurated spot on his left shoulder.  He reports being seen in urgent care and was prescribed doxycycline.  He has been taking this for the past 2.5 days.  He denies any fevers.  States that the symptoms started shortly after getting a new tattoo.  He is concerned that he has MRSA.  Denies any other associated symptoms.  The history is provided by the patient. No language interpreter was used.      Past Medical History:  Diagnosis Date   History of chicken pox    Kidney stone     Patient Active Problem List   Diagnosis Date Noted   Gastroesophageal reflux disease 07/29/2016   History of kidney stones 07/29/2016    Past Surgical History:  Procedure Laterality Date   SMALL INTESTINE SURGERY     as a baby       Family History  Problem Relation Age of Onset   Diabetes Mother    Hypertension Mother    Diabetes Maternal Grandmother    Cancer Neg Hx    Heart disease Neg Hx    Stroke Neg Hx     Social History   Tobacco Use   Smoking status: Never   Smokeless tobacco: Never  Substance Use Topics   Alcohol use: No   Drug use: No    Home Medications Prior to Admission medications   Medication Sig Start Date End Date Taking? Authorizing Provider  doxycycline (VIBRAMYCIN) 100 MG capsule Take 1 capsule (100 mg total) by mouth 2 (two) times daily. 01/26/21   Ivette Loyal, NP  Phenylephrine-APAP-guaiFENesin (MUCINEX SINUS-MAX SEV CONG/PN) 5-325-200 MG TABS Take by mouth as needed.    [provider]    Allergies    Patient has no known allergies.  Review of Systems   Review of  Systems  All other systems reviewed and are negative.  Physical Exam Updated Vital Signs BP 140/78   Pulse (!) 102   Temp 98.4 F (36.9 C)   Resp 20   SpO2 99%   Physical Exam Vitals and nursing note reviewed.  Constitutional:      General: He is not in acute distress.    Appearance: He is well-developed. He is not ill-appearing.  HENT:     Head: Normocephalic and atraumatic.  Eyes:     Conjunctiva/sclera: Conjunctivae normal.  Cardiovascular:     Rate and Rhythm: Normal rate.  Pulmonary:     Effort: Pulmonary effort is normal. No respiratory distress.  Abdominal:     General: There is no distension.  Musculoskeletal:     Cervical back: Neck supple.     Comments: Moves all extremities  Skin:    General: Skin is warm and dry.  Neurological:     Mental Status: He is alert and oriented to person, place, and time.  Psychiatric:        Mood and Affect: Mood normal.        Behavior: Behavior normal.    ED Results / Procedures / Treatments   Labs (all labs ordered are listed, but only  abnormal results are displayed) Labs Reviewed  CBC WITH DIFFERENTIAL/PLATELET - Abnormal; Notable for the following components:      Result Value   WBC 14.4 (*)    Neutro Abs 9.6 (*)    All other components within normal limits  CULTURE, BLOOD (ROUTINE X 2)  CULTURE, BLOOD (ROUTINE X 2)  COMPREHENSIVE METABOLIC PANEL    EKG None  Radiology No results found.  Procedures Ultrasound ED Soft Tissue  Date/Time: 01/29/2021 4:37 AM Performed by: Roxy Horseman, PA-C Authorized by: Roxy Horseman, PA-C   Procedure details:    Indications: localization of abscess     Transverse view:  Visualized   Longitudinal view:  Visualized   Images: archived   Location:    Location: upper extremity     Side:  Left Findings:     abscess present .Marland KitchenIncision and Drainage  Date/Time: 01/29/2021 5:26 AM Performed by: Roxy Horseman, PA-C Authorized by: Roxy Horseman, PA-C    Consent:    Consent obtained:  Verbal   Consent given by:  Patient   Risks discussed:  Bleeding, incomplete drainage, pain and damage to other organs   Alternatives discussed:  No treatment Universal protocol:    Procedure explained and questions answered to patient or proxy's satisfaction: yes     Relevant documents present and verified: yes     Test results available : yes     Imaging studies available: yes     Required blood products, implants, devices, and special equipment available: yes     Site/side marked: yes     Immediately prior to procedure, a time out was called: yes     Patient identity confirmed:  Verbally with patient Location:    Type:  Abscess   Location:  Upper extremity   Upper extremity location:  Arm   Arm location:  L upper arm Pre-procedure details:    Skin preparation:  Betadine Anesthesia:    Anesthesia method:  Local infiltration   Local anesthetic:  Lidocaine 1% WITH epi Procedure type:    Complexity:  Complex Procedure details:    Incision types:  Single straight   Incision depth:  Subcutaneous   Wound management:  Probed and deloculated, irrigated with saline and extensive cleaning   Drainage:  Purulent   Drainage amount:  Moderate   Packing materials:  1/4 in gauze Post-procedure details:    Procedure completion:  Tolerated well, no immediate complications   Medications Ordered in ED Medications  lidocaine-EPINEPHrine (XYLOCAINE W/EPI) 2 %-1:200000 (PF) injection 10 mL (has no administration in time range)    ED Course  I have reviewed the triage vital signs and the nursing notes.  Pertinent labs & imaging results that were available during my care of the patient were reviewed by me and considered in my medical decision making (see chart for details).    MDM Rules/Calculators/A&P                          Patient here with abscess to his left shoulder and right forearm.  Onset after getting a new tattoo.  Has had 5 doses of  doxycycline that was prescribed by urgent care.     I did I&D the abscess.  This should allow for better/quicker healing.  Advised to continue to the abx.  Return precautions given.   Patient understands and agrees with the plan. Final Clinical Impression(s) / ED Diagnoses Final diagnoses:  Abscess    Rx / DC Orders  ED Discharge Orders          Ordered    chlorhexidine (HIBICLENS) 4 % external liquid  Daily PRN        01/29/21 0527             Roxy Horseman, PA-C 01/29/21 0530    Sabas Sous, MD 01/29/21 [phone]

## 2021-01-29 NOTE — Telephone Encounter (Signed)
Rayfield Citizen (DPR signed) left v/m that pt was seen at [facility] ED on 01/28/21 for opening of an abscess and Rayfield Citizen wants to know if pt can take ibuprofen 800 mg (that they already have) for pain. See phone note from 01/28/21; per 01/28/21 pt plans to [facility] to a provider at [facility]. Sending note to Dr. Selena Batten who had video visit with pt on 11/22/20. Pt does not have upcoming scheduled appt for TOC.

## 2021-01-29 NOTE — Telephone Encounter (Signed)
Spoke to pt's wife (DPR) and let her know that it was ok, per Dr. Selena Batten, for pt to take 800mg  ibuprofen. Wife states that pt will be calling tomorrow to schedule a hospital follow-up appt.

## 2021-01-29 NOTE — Telephone Encounter (Signed)
That should be fine, though if pain worsening or swelling returns would recommend f/u visit

## 2021-01-29 NOTE — Discharge Instructions (Addendum)
Continue taking the Doxycycline.   Apply warm compresses.  Return for new or worsening symptoms.

## 2021-01-30 ENCOUNTER — Other Ambulatory Visit: Payer: Self-pay

## 2021-01-30 ENCOUNTER — Telehealth: Payer: Self-pay

## 2021-01-30 ENCOUNTER — Emergency Department (HOSPITAL_COMMUNITY)
Admission: EM | Admit: 2021-01-30 | Discharge: 2021-01-30 | Disposition: A | Discharge status: Home / Self Care | Payer: BC Managed Care – PPO | Attending: Emergency Medicine | Admitting: Emergency Medicine

## 2021-01-30 ENCOUNTER — Encounter (HOSPITAL_COMMUNITY): Payer: Self-pay

## 2021-01-30 DIAGNOSIS — L02414 Cutaneous abscess of left upper limb: Secondary | ICD-10-CM | POA: Insufficient documentation

## 2021-01-30 DIAGNOSIS — L02413 Cutaneous abscess of right upper limb: Secondary | ICD-10-CM | POA: Insufficient documentation

## 2021-01-30 DIAGNOSIS — D72829 Elevated white blood cell count, unspecified: Secondary | ICD-10-CM

## 2021-01-30 DIAGNOSIS — L0291 Cutaneous abscess, unspecified: Secondary | ICD-10-CM

## 2021-01-30 LAB — COMPREHENSIVE METABOLIC PANEL
ALT: 24 U/L (ref 0–44)
AST: 17 U/L (ref 15–41)
Albumin: 4.2 g/dL (ref 3.5–5.0)
Alkaline Phosphatase: 78 U/L (ref 38–126)
Anion gap: 9 (ref 5–15)
BUN: 8 mg/dL (ref 6–20)
CO2: 24 mmol/L (ref 22–32)
Calcium: 9.6 mg/dL (ref 8.9–10.3)
Chloride: 103 mmol/L (ref 98–111)
Creatinine, Ser: 0.95 mg/dL (ref 0.61–1.24)
GFR, Estimated: >60 mL/min (ref >60)
Glucose, Bld: 73 mg/dL (ref 70–99)
Potassium: 3.7 mmol/L (ref 3.5–5.1)
Sodium: 136 mmol/L (ref 135–145)
Total Bilirubin: 0.7 mg/dL (ref 0.3–1.2)
Total Protein: 8 g/dL (ref 6.5–8.1)

## 2021-01-30 LAB — CBC WITH DIFFERENTIAL/PLATELET
Abs Immature Granulocytes: 0.08 10*3/uL — ABNORMAL HIGH (ref 0.00–0.07)
Basophils Absolute: 0.1 10*3/uL (ref 0.0–0.1)
Basophils Relative: 0 %
Eosinophils Absolute: 0.2 10*3/uL (ref 0.0–0.5)
Eosinophils Relative: 1 %
HCT: 44.3 % (ref 39.0–52.0)
Hemoglobin: 14.7 g/dL (ref 13.0–17.0)
Immature Granulocytes: 1 %
Lymphocytes Relative: 24 %
Lymphs Abs: 4 10*3/uL (ref 0.7–4.0)
MCH: 27.8 pg (ref 26.0–34.0)
MCHC: 33.2 g/dL (ref 30.0–36.0)
MCV: 83.9 fL (ref 80.0–100.0)
Monocytes Absolute: 0.9 10*3/uL (ref 0.1–1.0)
Monocytes Relative: 5 %
Neutro Abs: 11.4 10*3/uL — ABNORMAL HIGH (ref 1.7–7.7)
Neutrophils Relative %: 69 %
Platelets: 362 10*3/uL (ref 150–400)
RBC: 5.28 MIL/uL (ref 4.22–5.81)
RDW: 12.8 % (ref 11.5–15.5)
WBC: 16.6 10*3/uL — ABNORMAL HIGH (ref 4.0–10.5)
nRBC: 0 % (ref 0.0–0.2)

## 2021-01-30 MED ORDER — SULFAMETHOXAZOLE-TRIMETHOPRIM 800-160 MG PO TABS: 1.0000 | ORAL_TABLET | Freq: Two times a day (BID) | ORAL | 0 refills | Status: DC

## 2021-01-30 NOTE — ED Provider Notes (Signed)
[facility] EMERGENCY DEPARTMENT Provider Note   CSN: [id number] Arrival date & time: 01/30/21  1240     History No chief complaint on file.   Andrew Ray is a 30 y.o. male.  Presents to ER with concern for abscess.  Patient reports his symptoms started a few days ago, noted small abscess to his right forearm and his left shoulder.  Went to urgent care on 7/9 and was prescribed doxycycline.  Did not have any improvement after starting the Doxy.  Came to ER on 7/11 and a incision and drainage was performed.  Patient tried following up with primary care doctor but they could not get an appointment today.  He noted the swelling in his left shoulder seem to worsen as well as on his right forearm.  Also noted some increased redness on his right forearm.  Endorsed chills but no fevers.  Denies medical problems.  A couple weeks ago had a tattoo placed on his left arm. HPI     Past Medical History:  Diagnosis Date   History of chicken pox    Kidney stone     Patient Active Problem List   Diagnosis Date Noted   Gastroesophageal reflux disease 07/29/2016   History of kidney stones 07/29/2016    Past Surgical History:  Procedure Laterality Date   SMALL INTESTINE SURGERY     as a baby       Family History  Problem Relation Age of Onset   Diabetes Mother    Hypertension Mother    Diabetes Maternal Grandmother    Cancer Neg Hx    Heart disease Neg Hx    Stroke Neg Hx     Social History   Tobacco Use   Smoking status: Never   Smokeless tobacco: Never  Substance Use Topics   Alcohol use: No   Drug use: No    Home Medications Prior to Admission medications   Medication Sig Start Date End Date Taking? Authorizing Provider  sulfamethoxazole-trimethoprim (BACTRIM DS) 800-160 MG tablet Take 1 tablet by mouth 2 (two) times daily for 7 days. 01/30/21 02/06/21 Yes Laporcha Marchesi, Quitman Livings, MD  chlorhexidine (HIBICLENS) 4 % external liquid Apply topically  daily as needed. 01/29/21   Roxy Horseman, PA-C  doxycycline (VIBRAMYCIN) 100 MG capsule Take 1 capsule (100 mg total) by mouth 2 (two) times daily. 01/26/21   Ivette Loyal, NP    Allergies    Patient has no known allergies.  Review of Systems   Review of Systems  Constitutional:  Negative for chills and fever.  HENT:  Negative for ear pain and sore throat.   Eyes:  Negative for pain and visual disturbance.  Respiratory:  Negative for cough and shortness of breath.   Cardiovascular:  Negative for chest pain and palpitations.  Gastrointestinal:  Negative for abdominal pain and vomiting.  Genitourinary:  Negative for dysuria and hematuria.  Musculoskeletal:  Negative for arthralgias and back pain.  Skin:  Positive for rash and wound. Negative for color change.  Neurological:  Negative for seizures and syncope.  All other systems reviewed and are negative.  Physical Exam Updated Vital Signs BP (!) 139/94   Pulse 97   Temp 98.7 F (37.1 C) (Oral)   Resp 18   SpO2 100%   Physical Exam Vitals and nursing note reviewed.  Constitutional:      Appearance: He is well-developed.  HENT:     Head: Normocephalic and atraumatic.  Eyes:  Conjunctiva/sclera: Conjunctivae normal.  Cardiovascular:     Rate and Rhythm: Normal rate and regular rhythm.     Heart sounds: No murmur heard. Pulmonary:     Effort: Pulmonary effort is normal. No respiratory distress.  Musculoskeletal:     Cervical back: Neck supple.     Comments: LUE: there is an area of ~2-3cm diameter indurated, tender over the lateral shoulder, small purulent drainage, normal shoulder ROM RUE: there is a small raised, indurated area ~1cm dia, some overlying erythema ~3cm in diameter, normal radial pulse, normal sensation  Skin:    General: Skin is warm and dry.  Neurological:     General: No focal deficit present.     Mental Status: He is alert and oriented to person, place, and time.  Psychiatric:        Mood and  Affect: Mood normal.    ED Results / Procedures / Treatments   Labs (all labs ordered are listed, but only abnormal results are displayed) Labs Reviewed  CBC WITH DIFFERENTIAL/PLATELET - Abnormal; Notable for the following components:      Result Value   WBC 16.6 (*)    Neutro Abs 11.4 (*)    Abs Immature Granulocytes 0.08 (*)    All other components within normal limits  COMPREHENSIVE METABOLIC PANEL    EKG None  Radiology No results found.  Procedures .Marland KitchenIncision and Drainage  Date/Time: 01/30/2021 6:45 PM Performed by: Milagros Loll, MD Authorized by: Milagros Loll, MD   Consent:    Consent obtained:  Verbal   Consent given by:  Patient   Risks, benefits, and alternatives were discussed: yes     Risks discussed:  Bleeding, incomplete drainage, pain, infection and damage to other organs   Alternatives discussed:  No treatment, delayed treatment, alternative treatment, observation and referral Universal protocol:    Immediately prior to procedure, a time out was called: yes     Patient identity confirmed:  Verbally with patient Location:    Type:  Abscess   Size:  3cm on shoulder, 1cm on right forearm   Location: see above. Pre-procedure details:    Skin preparation:  Povidone-iodine Sedation:    Sedation type:  None Anesthesia:    Anesthesia method:  Local infiltration   Local anesthetic:  Lidocaine 1% w/o epi Procedure type:    Complexity:  Complex (multiple small abscesses, packing) Procedure details:    Ultrasound guidance: yes     Incision types:  Single straight   Wound management:  Probed and deloculated and extensive cleaning   Drainage:  Purulent   Drainage amount:  Moderate   Wound treatment:  Drain placed   Packing materials:  1/4 in iodoform gauze (packing placed in L shoulder, no packing in R forearm) Post-procedure details:    Procedure completion:  Tolerated well, no immediate complications   Medications Ordered in ED Medications  - No data to display  ED Course  I have reviewed the triage vital signs and the nursing notes.  Pertinent labs & imaging results that were available during my care of the patient were reviewed by me and considered in my medical decision making (see chart for details).    MDM Rules/Calculators/A&P                          30 year old male presents to ER with concern for abscess.  Seen in ER a couple days ago for same.  Patient concerned reaccumulated.  On  exam he does have a small to moderate size superficial abscess to the outside of his left shoulder as well as the volar aspect of his right forearm.  The right forearm 1 is quite small.  Range of motion is intact in all of his joints, doubt deeper infection, believe both of these are relatively superficial based on exam.  Blood cultures from 2 days ago are no growth to date.  Patient has no fever and appears overall well.  Recommend repeat incision and drainage.  Patient consented and proceeded with procedure.  Achieved good drainage for both of these abscesses.  Placed packing in the left shoulder abscess.  Recommend patient have close follow-up with either PCP or general surgery for recheck of his wounds within 48 hours.  Discussed with pharmacy, Gretchen Short.D. recommended switching to Bactrim.  Reviewed return precautions in detail with patient.  Discharged.    After the discussed management above, the patient was determined to be safe for discharge.  The patient was in agreement with this plan and all questions regarding their care were answered.  ED return precautions were discussed and the patient will return to the ED with any significant worsening of condition.  Final Clinical Impression(s) / ED Diagnoses Final diagnoses:  Abscess  Leukocytosis, unspecified type    Rx / DC Orders ED Discharge Orders          Ordered    sulfamethoxazole-trimethoprim (BACTRIM DS) 800-160 MG tablet  2 times daily        01/30/21 1837              Milagros Loll, MD 01/30/21 1850

## 2021-01-30 NOTE — ED Triage Notes (Signed)
Patient here for further evaluation of left arm abscess that was drained yesterday. This am reports increased pain to left upper arm and taking doxy with no improvement

## 2021-01-30 NOTE — Discharge Instructions (Addendum)
Recommend following up with either your primary care doctor or with the general surgery clinic for recheck of your abscesses.  Ideally you should be seen on Friday.  I would recommend leaving the packing in place until you are seen in clinic on Friday.  If it falls out, do not attempt to replace.  If over the next couple days however you know that the swelling gets worse, redness increases, you have a fever, come back to ER for reassessment.  I would recommend switching your antibiotics to Bactrim.  Take your first dose this evening.

## 2021-01-30 NOTE — Telephone Encounter (Signed)
Morrie Sheldon Chrismon at front desk said that pt had already called about getting an appt today to have abscess drained again and Morrie Sheldon said pt was advised no available appts today at [facility] and pt was going to ED. Sending note as FYI to Dr Selena Batten.

## 2021-01-30 NOTE — Telephone Encounter (Signed)
[facility] Night - Client Nonclinical Telephone Record AccessNurse Client [facility] Night - Client Client Site [facility] [place] - Night Physician Nicki Reaper- NP Contact Type Call Who Is Calling Patient / Member / Family / Caregiver Caller Name Jevin Camino Caller Phone Number [phone] Patient Name Andrew Ray Patient DOB 02-Apr-1991 Call Type Message Only Information Provided Reason for Call Request to Schedule Office Appointment Initial Comment Caller had an appointment the other day but ending up going to the ER. His arm was lanced and drained. He is waiting on culture results. Last night when he took the bandage off it was already swollen again and filled with pus. Caller would like an appointment. Patient request to speak to RN No Disp. Time Disposition Final User 01/30/2021 7:20:42 AM General Information Provided Yes Simon Rhein Call Closed By: Simon Rhein Transaction Date/Time: 01/30/2021 7:17:29 AM (ET)

## 2021-01-30 NOTE — Telephone Encounter (Signed)
Noted, agree with evaluation 

## 2021-01-30 NOTE — Telephone Encounter (Signed)
Andrew Ray ended up going to ED on 01/28/21 where they I&D an abscess on his right forearm.  Patient cancelled his appointment with you on 01/29/2021.

## 2021-02-01 ENCOUNTER — Encounter: Payer: Self-pay | Admitting: Internal Medicine

## 2021-02-01 ENCOUNTER — Other Ambulatory Visit: Payer: Self-pay

## 2021-02-01 ENCOUNTER — Ambulatory Visit: Payer: BC Managed Care – PPO | Admitting: Internal Medicine

## 2021-02-01 DIAGNOSIS — L02419 Cutaneous abscess of limb, unspecified: Secondary | ICD-10-CM | POA: Insufficient documentation

## 2021-02-01 MED ORDER — SULFAMETHOXAZOLE-TRIMETHOPRIM 800-160 MG PO TABS: 2.0000 | ORAL_TABLET | Freq: Two times a day (BID) | ORAL | 0 refills | Status: AC

## 2021-02-01 NOTE — Progress Notes (Signed)
   Subjective:    Patient ID: Andrew Ray, male    DOB: 1991/02/22, 30 y.o.   MRN: [id number]  HPI Here for ER follow up-- wife here This visit occurred during the SARS-CoV-2 public health emergency.  Safety protocols were in place, including screening questions prior to the visit, additional usage of staff PPE, and extensive cleaning of exam room while observing appropriate contact time as indicated for disinfecting solutions.   Seen at urgent care for skin infection---Rx with doxy Went to work the next day and didn't feel well Left arm felt hard and swollen Went to ER----had I&D and sent home on the doxy still  Had persistent arm pain---burning sensation but sometimes throbbing  Went back to ER 2 days ago---I&D again (much more extensively) Also had incision of right arm site as well Left shoulder packed at that point Changed to bactrim DS 1 bid Feels that the left arm is back filled with pus again  Current Outpatient Medications on File Prior to Visit  Medication Sig Dispense Refill   chlorhexidine (HIBICLENS) 4 % external liquid Apply topically daily as needed. 120 mL 0   sulfamethoxazole-trimethoprim (BACTRIM DS) 800-160 MG tablet Take 1 tablet by mouth 2 (two) times daily for 7 days. 14 tablet 0   No current facility-administered medications on file prior to visit.    No Known Allergies  Past Medical History:  Diagnosis Date   History of chicken pox    Kidney stone     Past Surgical History:  Procedure Laterality Date   SMALL INTESTINE SURGERY     as a baby    Family History  Problem Relation Age of Onset   Diabetes Mother    Hypertension Mother    Diabetes Maternal Grandmother    Cancer Neg Hx    Heart disease Neg Hx    Stroke Neg Hx     Social History   Socioeconomic History   Marital status: Married    Spouse name: Not on file   Number of children: Not on file   Years of education: Not on file   Highest education level: Not on file   Occupational History   Not on file  Tobacco Use   Smoking status: Never   Smokeless tobacco: Never  Substance and Sexual Activity   Alcohol use: No   Drug use: No   Sexual activity: Yes  Other Topics Concern   Not on file  Social History Narrative   Not on file   Social Determinants of Health   Financial Resource Strain: Not on file  Food Insecurity: Not on file  Transportation Needs: Not on file  Physical Activity: Not on file  Stress: Not on file  Social Connections: Not on file  Intimate Partner Violence: Not on file   Review of Systems No fever No chills but did sweat at night 2 nights ago This all started after tattoo on left shoulder---went to the beach soon after it No prior skin infections like this Did have negative blood cultures with initial ER visit (7/11)    Objective:   Physical Exam Skin:    Comments: Small area of induration right forearm with overlying ulceration--pus on top removed and no clear abscess  Abscess in left shoulder with drain---when removed, pus started coming out. Drained 10-15cc. Small draining area medial to larger spot where I&D done           Assessment & Plan:

## 2021-02-01 NOTE — Assessment & Plan Note (Signed)
Drained a couple of times---current packing seemed to be preventing drainage though Discussed that he (or wife) should milk any pus out once or twice a day DSD applied  Will increase the trimeth/sulf to 2 bid

## 2021-02-02 LAB — CULTURE, BLOOD (ROUTINE X 2)
Culture: NO GROWTH
Culture: NO GROWTH
Special Requests: ADEQUATE
Special Requests: ADEQUATE

## 2022-08-22 ENCOUNTER — Telehealth: Payer: Self-pay | Admitting: Family Medicine

## 2022-08-22 DIAGNOSIS — R6889 Other general symptoms and signs: Secondary | ICD-10-CM

## 2022-08-22 DIAGNOSIS — Z20828 Contact with and (suspected) exposure to other viral communicable diseases: Secondary | ICD-10-CM

## 2022-08-22 MED ORDER — OSELTAMIVIR PHOSPHATE 75 MG PO CAPS: 75.0000 mg | ORAL_CAPSULE | Freq: Two times a day (BID) | ORAL | 0 refills | Status: AC

## 2022-08-22 NOTE — Progress Notes (Signed)
E visit for Flu like symptoms   We are sorry that you are not feeling well.  Here is how we plan to help! Based on what you have shared with me it looks like you may have a respiratory virus that may be influenza.  Influenza or "the flu" is   an infection caused by a respiratory virus. The flu virus is highly contagious and persons who did not receive their yearly flu vaccination may "catch" the flu from close contact.  We have anti-viral medications to treat the viruses that cause this infection. They are not a "cure" and only shorten the course of the infection. These prescriptions are most effective when they are given within the first 2 days of "flu" symptoms. Antiviral medication are indicated if you have a high risk of complications from the flu. You should  also consider an antiviral medication if you are in close contact with someone who is at risk. These medications can help patients avoid complications from the flu  but have side effects that you should know. Possible side effects from Tamiflu or oseltamivir include nausea, vomiting, diarrhea, dizziness, headaches, eye redness, sleep problems or other respiratory symptoms. You should not take Tamiflu if you have an allergy to oseltamivir or any to the ingredients in Tamiflu.  Based upon your symptoms and potential risk factors I have prescribed Oseltamivir (Tamiflu).  It has been sent to your designated pharmacy.  You will take one 75 mg capsule orally twice a day for the next 5 days. and I recommend that you follow the flu symptoms recommendation that I have listed below.  ANYONE WHO HAS FLU SYMPTOMS SHOULD: Stay home. The flu is highly contagious and going out or to work exposes others! Be sure to drink plenty of fluids. Water is fine as well as fruit juices, sodas and electrolyte beverages. You may want to stay away from caffeine or alcohol. If you are nauseated, try taking small sips of liquids. How do you know if you are getting enough  fluid? Your urine should be a pale yellow or almost colorless. Get rest. Taking a steamy shower or using a humidifier may help nasal congestion and ease sore throat pain. Using a saline nasal spray works much the same way. Cough drops, hard candies and sore throat lozenges may ease your cough. Line up a caregiver. Have someone check on you regularly.   GET HELP RIGHT AWAY IF: You cannot keep down liquids or your medications. You become short of breath Your fell like you are going to pass out or loose consciousness. Your symptoms persist after you have completed your treatment plan MAKE SURE YOU  Understand these instructions. Will watch your condition. Will get help right away if you are not doing well or get worse.  Your e-visit answers were reviewed by a board certified advanced clinical practitioner to complete your personal care plan.  Depending on the condition, your plan could have included both over the counter or prescription medications.  If there is a problem please reply  once you have received a response from your provider.  Your safety is important to us.  If you have drug allergies check your prescription carefully.    You can use MyChart to ask questions about today's visit, request a non-urgent call back, or ask for a work or school excuse for 24 hours related to this e-Visit. If it has been greater than 24 hours you will need to follow up with your provider, or enter a   new e-Visit to address those concerns.  You will get an e-mail in the next two days asking about your experience.  I hope that your e-visit has been valuable and will speed your recovery. Thank you for using e-visits.  I provided 5 minutes of non face-to-face time during this encounter for chart review, medication and order placement, as well as and documentation.   

## 2022-11-02 ENCOUNTER — Telehealth: Payer: Self-pay | Admitting: Family

## 2022-11-02 DIAGNOSIS — Z20818 Contact with and (suspected) exposure to other bacterial communicable diseases: Secondary | ICD-10-CM

## 2022-11-02 DIAGNOSIS — J029 Acute pharyngitis, unspecified: Secondary | ICD-10-CM

## 2022-11-02 MED ORDER — AMOXICILLIN 500 MG PO CAPS: 500.0000 mg | ORAL_CAPSULE | Freq: Two times a day (BID) | ORAL | 0 refills | Status: AC

## 2022-11-02 NOTE — Progress Notes (Signed)

## 2023-07-25 ENCOUNTER — Emergency Department (HOSPITAL_BASED_OUTPATIENT_CLINIC_OR_DEPARTMENT_OTHER)
Admission: EM | Admit: 2023-07-25 | Discharge: 2023-07-25 | Disposition: A | Discharge status: Home / Self Care | Payer: Self-pay | Attending: Emergency Medicine | Admitting: Emergency Medicine

## 2023-07-25 ENCOUNTER — Encounter (HOSPITAL_BASED_OUTPATIENT_CLINIC_OR_DEPARTMENT_OTHER): Payer: Self-pay | Admitting: Urology

## 2023-07-25 ENCOUNTER — Emergency Department (HOSPITAL_BASED_OUTPATIENT_CLINIC_OR_DEPARTMENT_OTHER): Payer: Self-pay | Admitting: Radiology

## 2023-07-25 DIAGNOSIS — J208 Acute bronchitis due to other specified organisms: Secondary | ICD-10-CM | POA: Insufficient documentation

## 2023-07-25 DIAGNOSIS — Z20822 Contact with and (suspected) exposure to covid-19: Secondary | ICD-10-CM | POA: Insufficient documentation

## 2023-07-25 DIAGNOSIS — R112 Nausea with vomiting, unspecified: Secondary | ICD-10-CM | POA: Insufficient documentation

## 2023-07-25 DIAGNOSIS — R519 Headache, unspecified: Secondary | ICD-10-CM | POA: Insufficient documentation

## 2023-07-25 DIAGNOSIS — R42 Dizziness and giddiness: Secondary | ICD-10-CM | POA: Insufficient documentation

## 2023-07-25 DIAGNOSIS — B9789 Other viral agents as the cause of diseases classified elsewhere: Secondary | ICD-10-CM | POA: Insufficient documentation

## 2023-07-25 LAB — RESP PANEL BY RT-PCR (RSV, FLU A&B, COVID)  RVPGX2
Influenza A by PCR: NEGATIVE
Influenza B by PCR: NEGATIVE
Resp Syncytial Virus by PCR: NEGATIVE
SARS Coronavirus 2 by RT PCR: NEGATIVE

## 2023-07-25 MED ORDER — ALBUTEROL SULFATE HFA 108 (90 BASE) MCG/ACT IN AERS
2.0000 | INHALATION_SPRAY | Freq: Once | RESPIRATORY_TRACT | Status: AC
  Administered 2023-07-25: 2 via RESPIRATORY_TRACT
  Filled 2023-07-25: qty 6.7

## 2023-07-25 MED ORDER — ONDANSETRON HCL 4 MG PO TABS: 4.0000 mg | ORAL_TABLET | Freq: Four times a day (QID) | ORAL | 0 refills | Status: AC

## 2023-07-25 MED ORDER — AEROCHAMBER PLUS FLO-VU MISC
1.0000 | Freq: Once | Status: AC
  Administered 2023-07-25: 1
  Filled 2023-07-25: qty 1

## 2023-07-25 NOTE — Discharge Instructions (Signed)
 You were seen in the emergency department today for cough, nausea, headache.  As we discussed you tested negative for flu, COVID, and RSV.  However, I think you likely have a different virus causing your symptoms today. We unfortunately cannot test for them all, but we treat them all the same way.   You can take ibuprofen or Tylenol  for pain or fever, and I recommend alternating between the 2.  Make sure that you are drinking lots of fluids and getting plenty of rest. You can take decongestants as long as you take them with lots of water. You can use lozenges or chloraseptic spray as needed for sore throat.   Please use Tylenol  or ibuprofen for pain.  You may use 600 mg ibuprofen every 6 hours or 1000 mg of Tylenol  every 6 hours.  You may choose to alternate between the 2.  This would be most effective.  Do not exceed 4 g of Tylenol  within 24 hours.  Do not exceed 3200 mg ibuprofen within 24 hours.  You can use the inhaler every 6 hours as needed for shortness of breath or wheezing. You can take the nausea medication every 6-8 hours also.   Continue to monitor how you are doing, and return to the emergency department for new or worsening symptoms such as chest pain, difficulty breathing not related to coughing, fever despite medication, or persistent vomiting or diarrhea.  It has been a pleasure taking care of you today and I hope you begin to feel better soon!

## 2023-07-25 NOTE — ED Notes (Signed)
 Patient ambulatory to treatment room, no acute distress noted.

## 2023-07-25 NOTE — ED Provider Notes (Signed)
 [place] EMERGENCY DEPARTMENT AT [facility] Provider Note   CSN: [id number] Arrival date & time: 07/25/23  1054     History  Chief Complaint  Patient presents with   Cough    Andrew Ray is a 33 y.o. male who presents the emergency department complaining of cough, headache, nausea, and dizziness.  Patient states he has had an intermittent cough for about 2 months or so.  His wife recently had pneumonia, he thinks that he got what she had.  Their kids are also sick.  He still has a productive cough with yellow and dark brown mucus.  States that 2 days ago he started having persistent headache, nausea, and feeling dizzy.  Did have 1 episode of vomiting but is after a period of heavy coughing.  He has not taken anything for his symptoms. Reports intermittent wheezing at home.    Cough Associated symptoms: headaches and wheezing        Home Medications Prior to Admission medications   Medication Sig Start Date End Date Taking? Authorizing Provider  ondansetron  (ZOFRAN ) 4 MG tablet Take 1 tablet (4 mg total) by mouth every 6 (six) hours. 07/25/23  Yes Britlee Skolnik T, PA-C  chlorhexidine (HIBICLENS ) 4 % external liquid Apply topically daily as needed. 01/29/21   Vicky Charleston, PA-C  sulfamethoxazole -trimethoprim  (BACTRIM  DS) 800-160 MG tablet Take 2 tablets by mouth 2 (two) times daily. 02/01/21   Jimmy Charlie FERNS, MD      Allergies    Patient has no known allergies.    Review of Systems   Review of Systems  Constitutional:  Positive for fatigue.  Respiratory:  Positive for cough and wheezing.   Gastrointestinal:  Positive for nausea.  Neurological:  Positive for dizziness and headaches.  All other systems reviewed and are negative.   Physical Exam Updated Vital Signs BP 135/86   Pulse 76   Temp 97.8 F (36.6 C)   Resp 16   Ht 5' 9 (1.753 m)   Wt 131.1 kg   SpO2 96%   BMI 42.68 kg/m  Physical Exam Vitals and nursing note reviewed.   Constitutional:      Appearance: Normal appearance.  HENT:     Head: Normocephalic and atraumatic.  Eyes:     Conjunctiva/sclera: Conjunctivae normal.  Cardiovascular:     Rate and Rhythm: Normal rate and regular rhythm.  Pulmonary:     Effort: Pulmonary effort is normal. No respiratory distress.     Breath sounds: Normal breath sounds.  Abdominal:     General: There is no distension.     Palpations: Abdomen is soft.     Tenderness: There is no abdominal tenderness.  Skin:    General: Skin is warm and dry.  Neurological:     General: No focal deficit present.     Mental Status: He is alert.     ED Results / Procedures / Treatments   Labs (all labs ordered are listed, but only abnormal results are displayed) Labs Reviewed  RESP PANEL BY RT-PCR (RSV, FLU A&B, COVID)  RVPGX2    EKG None  Radiology DG Chest 2 View Result Date: 07/25/2023 CLINICAL DATA:  Cough for 2 months. EXAM: CHEST - 2 VIEW COMPARISON:  Chest radiograph dated 12/08/2009. FINDINGS: The heart size and mediastinal contours are within normal limits. Both lungs are clear. The visualized skeletal structures are unremarkable. IMPRESSION: No active cardiopulmonary disease. Electronically Signed   By: Norman Hopper M.D.   On: 07/25/2023 12:03  Procedures Procedures    Medications Ordered in ED Medications  albuterol  (VENTOLIN  HFA) 108 (90 Base) MCG/ACT inhaler 2 puff (2 puffs Inhalation Given 07/25/23 1506)  Aerochamber Plus device 1 each (1 each Other Given 07/25/23 1507)    ED Course/ Medical Decision Making/ A&P                                 Medical Decision Making Amount and/or Complexity of Data Reviewed Radiology: ordered.  Risk Prescription drug management.   This patient is a 33 y.o. male who presents to the ED for concern of cough x 2 months, headache, nausea, dizziness.   Differential diagnoses prior to evaluation: The emergent differential diagnosis includes, but is not limited to,   upper respiratory infection, lower respiratory infection, allergies, asthma, irritants, sinus/esophageal foreign body, medications, reflux, interstitial lung disease, postnasal drip, viral illness, sepsis. This is not an exhaustive differential.   Past Medical History / Co-morbidities / Additional history: Chart reviewed. Pertinent results include: kidney stone, chicken pox  Physical Exam: Physical exam performed. The pertinent findings include: Normal vital signs, no acute distress.  Heart regular rate and rhythm, lung sounds clear.  Normal respiratory effort, normal SpO2 on room air.  Lab Tests/Imaging studies: I personally interpreted labs/imaging and the pertinent results include:  respiratory panel negative.   Chest x-ray without acute abnormalities.  I reviewed the imaging and agree with the radiologist interpretation.   Medications: I ordered medication including albuterol  inhaler for wheezing at home.  I have reviewed the patients home medicines and have made adjustments as needed.   Disposition: After consideration of the diagnostic results and the patients response to treatment, I feel that emergency department workup does not suggest an emergent condition requiring admission or immediate intervention beyond what has been performed at this time. Patient with symptoms consistent with viral bronchitis.  Vitals are stable. No signs of dehydration, tolerating PO's.  Lungs are clear.   The plan is: Patient will be discharged with instructions to orally hydrate, rest, and use over-the-counter medications such as anti-inflammatories such as ibuprofen and Tylenol  for fever. The patient is safe for discharge and has been instructed to return immediately for worsening symptoms, change in symptoms or any other concerns.  Final Clinical Impression(s) / ED Diagnoses Final diagnoses:  Viral bronchitis    Rx / DC Orders ED Discharge Orders          Ordered    ondansetron  (ZOFRAN ) 4 MG tablet   Every 6 hours        07/25/23 1458           Portions of this report may have been transcribed using voice recognition software. Every effort was made to ensure accuracy; however, inadvertent computerized transcription errors may be present.    Andrew Ray 07/25/23 1520    Lenor Hollering, MD 07/25/23 [id number]

## 2023-07-25 NOTE — ED Triage Notes (Signed)
 Pt states cough x 2 month that has been intermittent  States dark brown mucus  Starting 2 days ago has had headache, nausea, and dizziness

## 2023-08-08 ENCOUNTER — Other Ambulatory Visit: Payer: Self-pay

## 2023-08-08 ENCOUNTER — Encounter (HOSPITAL_BASED_OUTPATIENT_CLINIC_OR_DEPARTMENT_OTHER): Payer: Self-pay

## 2023-08-08 ENCOUNTER — Emergency Department (HOSPITAL_BASED_OUTPATIENT_CLINIC_OR_DEPARTMENT_OTHER): Payer: 59 | Admitting: Radiology

## 2023-08-08 ENCOUNTER — Emergency Department (HOSPITAL_BASED_OUTPATIENT_CLINIC_OR_DEPARTMENT_OTHER)
Admission: EM | Admit: 2023-08-08 | Discharge: 2023-08-08 | Disposition: A | Discharge status: Home / Self Care | Payer: 59 | Attending: Emergency Medicine | Admitting: Emergency Medicine

## 2023-08-08 DIAGNOSIS — R0602 Shortness of breath: Secondary | ICD-10-CM | POA: Insufficient documentation

## 2023-08-08 DIAGNOSIS — R0789 Other chest pain: Secondary | ICD-10-CM | POA: Diagnosis not present

## 2023-08-08 DIAGNOSIS — R519 Headache, unspecified: Secondary | ICD-10-CM | POA: Insufficient documentation

## 2023-08-08 DIAGNOSIS — R000 Tachycardia, unspecified: Secondary | ICD-10-CM | POA: Diagnosis not present

## 2023-08-08 DIAGNOSIS — R052 Subacute cough: Secondary | ICD-10-CM | POA: Insufficient documentation

## 2023-08-08 DIAGNOSIS — R059 Cough, unspecified: Secondary | ICD-10-CM | POA: Diagnosis present

## 2023-08-08 DIAGNOSIS — Z20822 Contact with and (suspected) exposure to covid-19: Secondary | ICD-10-CM | POA: Diagnosis not present

## 2023-08-08 DIAGNOSIS — R42 Dizziness and giddiness: Secondary | ICD-10-CM | POA: Diagnosis not present

## 2023-08-08 LAB — RESP PANEL BY RT-PCR (RSV, FLU A&B, COVID)  RVPGX2
Influenza A by PCR: NEGATIVE
Influenza B by PCR: NEGATIVE
Resp Syncytial Virus by PCR: NEGATIVE
SARS Coronavirus 2 by RT PCR: NEGATIVE

## 2023-08-08 LAB — CBC WITH DIFFERENTIAL/PLATELET
Abs Immature Granulocytes: 0.07 10*3/uL (ref 0.00–0.07)
Basophils Absolute: 0.1 10*3/uL (ref 0.0–0.1)
Basophils Relative: 1 %
Eosinophils Absolute: 0.1 10*3/uL (ref 0.0–0.5)
Eosinophils Relative: 1 %
HCT: 41.6 % (ref 39.0–52.0)
Hemoglobin: 13.8 g/dL (ref 13.0–17.0)
Immature Granulocytes: 1 %
Lymphocytes Relative: 17 %
Lymphs Abs: 2.6 10*3/uL (ref 0.7–4.0)
MCH: 26.8 pg (ref 26.0–34.0)
MCHC: 33.2 g/dL (ref 30.0–36.0)
MCV: 80.8 fL (ref 80.0–100.0)
Monocytes Absolute: 0.7 10*3/uL (ref 0.1–1.0)
Monocytes Relative: 5 %
Neutro Abs: 11.9 10*3/uL — ABNORMAL HIGH (ref 1.7–7.7)
Neutrophils Relative %: 75 %
Platelets: 299 10*3/uL (ref 150–400)
RBC: 5.15 MIL/uL (ref 4.22–5.81)
RDW: 13.2 % (ref 11.5–15.5)
WBC: 15.4 10*3/uL — ABNORMAL HIGH (ref 4.0–10.5)
nRBC: 0 % (ref 0.0–0.2)

## 2023-08-08 LAB — BASIC METABOLIC PANEL
Anion gap: 10 (ref 5–15)
BUN: 11 mg/dL (ref 6–20)
CO2: 25 mmol/L (ref 22–32)
Calcium: 9.2 mg/dL (ref 8.9–10.3)
Chloride: 101 mmol/L (ref 98–111)
Creatinine, Ser: 0.87 mg/dL (ref 0.61–1.24)
GFR, Estimated: >60 mL/min (ref >60)
Glucose, Bld: 116 mg/dL — ABNORMAL HIGH (ref 70–99)
Potassium: 3.5 mmol/L (ref 3.5–5.1)
Sodium: 136 mmol/L (ref 135–145)

## 2023-08-08 LAB — D-DIMER, QUANTITATIVE: D-Dimer, Quant: <0.27 ug{FEU}/mL (ref 0.00–0.50)

## 2023-08-08 LAB — TROPONIN I (HIGH SENSITIVITY): Troponin I (High Sensitivity): 3 ng/L (ref <18)

## 2023-08-08 MED ORDER — ALBUTEROL SULFATE (2.5 MG/3ML) 0.083% IN NEBU
5.0000 mg | INHALATION_SOLUTION | Freq: Once | RESPIRATORY_TRACT | Status: AC
  Administered 2023-08-08: 5 mg via RESPIRATORY_TRACT
  Filled 2023-08-08: qty 6

## 2023-08-08 MED ORDER — PROMETHAZINE-DM 6.25-15 MG/5ML PO SYRP: 5.0000 mL | ORAL_SOLUTION | Freq: Four times a day (QID) | ORAL | 0 refills | Status: AC | PRN

## 2023-08-08 MED ORDER — PREDNISONE 20 MG PO TABS: 40.0000 mg | ORAL_TABLET | Freq: Every day | ORAL | 0 refills | Status: AC

## 2023-08-08 MED ORDER — SODIUM CHLORIDE 0.9 % IV BOLUS
1000.0000 mL | Freq: Once | INTRAVENOUS | Status: AC
  Administered 2023-08-08: 1000 mL via INTRAVENOUS

## 2023-08-08 NOTE — Discharge Instructions (Signed)
You have been seen today for your complaint of cough, shortness of breath. Your lab work was reassuring. Your imaging was reassuring. Your discharge medications include prednisone.  This is a steroid.  Take as prescribed and for the entire duration of the prescription. Promethazine cough syrup.  Take this as needed. Follow up with: A primary care provider in 1 week for reevaluation Please seek immediate medical care if you develop any of the following symptoms: Cough up blood. Feel pain in your chest. Have severe shortness of breath. Faint or keep feeling like you are going to faint. Have a severe headache. Have a fever or chills that get worse. At this time there does not appear to be the presence of an emergent medical condition, however there is always the potential for conditions to change. Please read and follow the below instructions.  Do not take your medicine if  develop an itchy rash, swelling in your mouth or lips, or difficulty breathing; call 911 and seek immediate emergency medical attention if this occurs.  You may review your lab tests and imaging results in their entirety on your MyChart account.  Please discuss all results of fully with your primary care provider and other specialist at your follow-up visit.  Note: Portions of this text may have been transcribed using voice recognition software. Every effort was made to ensure accuracy; however, inadvertent computerized transcription errors may still be present.

## 2023-08-08 NOTE — ED Triage Notes (Addendum)
Patient arrives ambulatory to the ED with complaints of worsening shortness of breath, fatigue, and cough x2 weeks.  Patient was seen here for the same and all of his testing was negative, including his xray.

## 2023-08-08 NOTE — ED Provider Notes (Signed)
[place] EMERGENCY DEPARTMENT AT [facility] Provider Note   CSN: [id number] Arrival date & time: 08/08/23  0941     History  Chief Complaint  Patient presents with   Cough   Shortness of Breath    Andrew Ray is a 33 y.o. male.  With history of kidney stones presenting to the ED for evaluation of cough and shortness of breath.  He states approximately 2 weeks ago he developed upper respiratory infection type symptoms including congestion, rhinorrhea, headaches, dizziness, cough, shortness of breath.  He presented to the emergency department at that time and had a reassuring workup including chest x-ray.  He was told he has bronchitis.  He was given an albuterol inhaler which he has been using every 6 hours for the past 2 weeks.  He states his congestion and rhinorrhea have improved, however his cough and shortness of breath persist.  He reports dyspnea on exertion as well.  Cough is productive of yellow to brown sputum.  He reports chest tightness and pressure as well.  He states he has had an intermittent fever, typically before going to bed as well.  Most recent measured temperature was 102 Fahrenheit 2 days ago.  He has not taken any antipyretics today.  States his typical resting heart rate is 100 bpm but his Apple Watch has shown him that his heart rate over the past couple of days has stayed persistently above 120 bpm.   Cough Associated symptoms: shortness of breath   Shortness of Breath Associated symptoms: cough        Home Medications Prior to Admission medications   Medication Sig Start Date End Date Taking? Authorizing Provider  predniSONE (DELTASONE) 20 MG tablet Take 2 tablets (40 mg total) by mouth daily for 5 days. 08/08/23 08/13/23 Yes Harrel Ferrone, Edsel Petrin, PA-C  promethazine-dextromethorphan (PROMETHAZINE-DM) 6.25-15 MG/5ML syrup Take 5 mLs by mouth 4 (four) times daily as needed for cough. 08/08/23  Yes [place] Latulippe, Edsel Petrin, PA-C  chlorhexidine  (HIBICLENS) 4 % external liquid Apply topically daily as needed. 01/29/21   Roxy Horseman, PA-C  ondansetron (ZOFRAN) 4 MG tablet Take 1 tablet (4 mg total) by mouth every 6 (six) hours. 07/25/23   Roemhildt, Lorin T, PA-C  sulfamethoxazole-trimethoprim (BACTRIM DS) 800-160 MG tablet Take 2 tablets by mouth 2 (two) times daily. 02/01/21   Karie Schwalbe, MD      Allergies    Patient has no known allergies.    Review of Systems   Review of Systems  Respiratory:  Positive for cough, chest tightness and shortness of breath.   All other systems reviewed and are negative.   Physical Exam Updated Vital Signs BP (!) 143/86 (BP Location: Left Arm)   Pulse 89   Temp 98.5 F (36.9 C) (Oral)   Resp 18   Ht 5\' 9"  (1.753 m)   Wt 131.1 kg   SpO2 99%   BMI 42.68 kg/m  Physical Exam Vitals and nursing note reviewed.  Constitutional:      General: He is not in acute distress.    Appearance: He is well-developed.     Comments: Resting comfortably in bed  HENT:     Head: Normocephalic and atraumatic.  Eyes:     Conjunctiva/sclera: Conjunctivae normal.  Cardiovascular:     Rate and Rhythm: Regular rhythm. Tachycardia present.     Heart sounds: No murmur heard. Pulmonary:     Effort: Pulmonary effort is normal. Tachypnea present. No respiratory distress.  Breath sounds: Rhonchi present. No decreased breath sounds, wheezing or rales.  Abdominal:     Palpations: Abdomen is soft.     Tenderness: There is no abdominal tenderness.  Musculoskeletal:        General: No swelling.     Cervical back: Neck supple.     Right lower leg: No edema.     Left lower leg: No edema.  Skin:    General: Skin is warm and dry.     Capillary Refill: Capillary refill takes less than 2 seconds.  Neurological:     Mental Status: He is alert.  Psychiatric:        Mood and Affect: Mood normal.     ED Results / Procedures / Treatments   Labs (all labs ordered are listed, but only abnormal results are  displayed) Labs Reviewed  BASIC METABOLIC PANEL - Abnormal; Notable for the following components:      Result Value   Glucose, Bld 116 (*)    All other components within normal limits  CBC WITH DIFFERENTIAL/PLATELET - Abnormal; Notable for the following components:   WBC 15.4 (*)    Neutro Abs 11.9 (*)    All other components within normal limits  RESP PANEL BY RT-PCR (RSV, FLU A&B, COVID)  RVPGX2  D-DIMER, QUANTITATIVE  TROPONIN I (HIGH SENSITIVITY)    EKG None  Radiology DG Chest 2 View Result Date: 08/08/2023 CLINICAL DATA:  Cough with worsening shortness of breath and fatigue 2 weeks. EXAM: CHEST - 2 VIEW COMPARISON:  07/25/2023 FINDINGS: Lungs are adequately inflated without focal airspace consolidation, effusion or pneumothorax. Cardiomediastinal silhouette, bones and soft tissues are normal. IMPRESSION: No active cardiopulmonary disease. Electronically Signed   By: Elberta Fortis M.D.   On: 08/08/2023 10:51    Procedures Procedures    Medications Ordered in ED Medications  albuterol (PROVENTIL) (2.5 MG/3ML) 0.083% nebulizer solution 5 mg (5 mg Nebulization Given 08/08/23 1131)  sodium chloride 0.9 % bolus 1,000 mL (0 mLs Intravenous Stopped 08/08/23 1436)    ED Course/ Medical Decision Making/ A&P                                 Medical Decision Making Amount and/or Complexity of Data Reviewed Labs: ordered. Radiology: ordered.  Risk Prescription drug management.  This patient presents to the ED for concern of cough, shortness of breath, this involves an extensive number of treatment options, and is a complaint that carries with it a high risk of complications and morbidity.  Differential diagnosis for emergent cause of cough includes but is not limited to upper respiratory infection, lower respiratory infection, allergies, asthma, irritants, foreign body, medications such as ACE inhibitors, reflux, asthma, CHF, lung cancer, interstitial lung disease, psychiatric  causes, postnasal drip and postinfectious bronchospasm.   My initial workup includes respiratory panel, labs, imaging, EKG, fluids, albuterol  Additional history obtained from: Nursing notes from this visit. ED visit for same on 07/25/2023.  I ordered, reviewed and interpreted labs which include: BMP, CBC, troponin, D-dimer, respiratory panel.  Respiratory panel negative.  Leukocytosis of 15.4.  On chart review, patient typically does have leukocytosis.  No significant electrolyte derangement or kidney dysfunction.  Normal anion gap.  Troponin normal.  D-dimer negative.  I ordered imaging studies including chest x-ray I independently visualized and interpreted imaging which showed no acute cardiopulmonary abnormalities I agree with the radiologist interpretation  Cardiac Monitoring:  The patient was  maintained on a cardiac monitor.  I personally viewed and interpreted the cardiac monitored which showed an underlying rhythm of: Sinus tachycardia  Afebrile, initially tachycardic but otherwise hemodynamically stable.  33 year old male presents to the ED for evaluation of cough and shortness of breath.  Symptoms have been present for 2 weeks.  He has been using his albuterol inhaler with minimal improvement.  He appears very well on physical exam.  There is some expiratory rhonchi.  No tachypnea or increased work of breathing.  He is able to speak in full sentences.  Lab workup was reassuring.  D-dimer negative, lower suspicion for PE.  Troponin negative.  Chest x-ray without acute cardiopulmonary abnormalities.  Tachycardia improved after IV fluids.  Suspect bronchitis.  Will treat with steroid burst and antitussive.  He was encouraged to establish care with a primary care provider and follow-up for reassessment.  He was given return precautions.  Stable at discharge.  At this time there does not appear to be any evidence of an acute emergency medical condition and the patient appears stable for  discharge with appropriate outpatient follow up. Diagnosis was discussed with patient who verbalizes understanding of care plan and is agreeable to discharge. I have discussed return precautions with patient who verbalizes understanding. Patient encouraged to follow-up with their PCP within 1 week. All questions answered.  Note: Portions of this report may have been transcribed using voice recognition software. Every effort was made to ensure accuracy; however, inadvertent computerized transcription errors may still be present.         Final Clinical Impression(s) / ED Diagnoses Final diagnoses:  Subacute cough    Rx / DC Orders ED Discharge Orders          Ordered    promethazine-dextromethorphan (PROMETHAZINE-DM) 6.25-15 MG/5ML syrup  4 times daily PRN        08/08/23 1505    predniSONE (DELTASONE) 20 MG tablet  Daily        08/08/23 1505              Michelle Piper, Cordelia Poche 08/08/23 1510    Margarita Grizzle, MD 08/09/23 1630

## 2023-11-18 ENCOUNTER — Emergency Department (HOSPITAL_BASED_OUTPATIENT_CLINIC_OR_DEPARTMENT_OTHER)
Admission: EM | Admit: 2023-11-18 | Discharge: 2023-11-19 | Disposition: A | Discharge status: Home / Self Care | Attending: Emergency Medicine | Admitting: Emergency Medicine

## 2023-11-18 ENCOUNTER — Emergency Department (HOSPITAL_BASED_OUTPATIENT_CLINIC_OR_DEPARTMENT_OTHER): Admitting: Radiology

## 2023-11-18 ENCOUNTER — Other Ambulatory Visit: Payer: Self-pay

## 2023-11-18 DIAGNOSIS — R0789 Other chest pain: Secondary | ICD-10-CM | POA: Diagnosis present

## 2023-11-18 DIAGNOSIS — R079 Chest pain, unspecified: Secondary | ICD-10-CM

## 2023-11-18 NOTE — ED Triage Notes (Signed)
 Central CP and pressure on and off 3 days. Does not seem to be worse with activity. 2 baby ASA PTA.

## 2023-11-19 LAB — CBC
HCT: 42.2 % (ref 39.0–52.0)
Hemoglobin: 14.1 g/dL (ref 13.0–17.0)
MCH: 27.1 pg (ref 26.0–34.0)
MCHC: 33.4 g/dL (ref 30.0–36.0)
MCV: 81 fL (ref 80.0–100.0)
Platelets: 304 10*3/uL (ref 150–400)
RBC: 5.21 MIL/uL (ref 4.22–5.81)
RDW: 13.5 % (ref 11.5–15.5)
WBC: 12.2 10*3/uL — ABNORMAL HIGH (ref 4.0–10.5)
nRBC: 0 % (ref 0.0–0.2)

## 2023-11-19 LAB — BASIC METABOLIC PANEL WITH GFR
Anion gap: 12 (ref 5–15)
BUN: 9 mg/dL (ref 6–20)
CO2: 26 mmol/L (ref 22–32)
Calcium: 9.8 mg/dL (ref 8.9–10.3)
Chloride: 103 mmol/L (ref 98–111)
Creatinine, Ser: 1.01 mg/dL (ref 0.61–1.24)
GFR, Estimated: >60 mL/min (ref >60)
Glucose, Bld: 112 mg/dL — ABNORMAL HIGH (ref 70–99)
Potassium: 3.4 mmol/L — ABNORMAL LOW (ref 3.5–5.1)
Sodium: 140 mmol/L (ref 135–145)

## 2023-11-19 LAB — TROPONIN T, HIGH SENSITIVITY: Troponin T High Sensitivity: <15 ng/L (ref <19)

## 2023-11-19 NOTE — ED Notes (Signed)
 RN reviewed discharge instructions with pt. Pt verbalized understanding and had no further questions. VSS upon discharge.

## 2023-11-19 NOTE — Discharge Instructions (Signed)
 Follow-up with cardiology in the next week.  The contact information for the cardiology clinic has been provided in this discharge summary for you to call and make these arrangements.  Return to the emergency department in the meantime if you develop worsening pain, difficulty breathing, or for other new and concerning symptoms.

## 2023-11-19 NOTE — ED Provider Notes (Signed)
 [place] [facility]ERGENCY DEPARTMENT AT [facility] Provider Note   CSN: [id number] Arrival date & time: 11/18/23  2330     History  Chief Complaint  Patient presents with   Chest Pain    Andrew Ray is a 33 y.o. male.  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  Patient presenting today with complaints of chest discomfort.  He reports a 3-day history of intermittent tightness in his chest that seems to occur randomly.  This happens several times throughout the day and is not associated with any shortness of breath, nausea, diaphoresis, or radiation to the arm or jaw.  He denies any recent exertional symptoms.  Patient does have a family history with his mother having had stents placed at the age of 73.  Patient has no history of diabetes, cholesterol, or hypertension.  He is a non-smoker.       Home Medications Prior to Admission medications   Medication Sig Start Date End Date Taking? Authorizing Provider  chlorhexidine (HIBICLENS ) 4 % external liquid Apply topically daily as needed. 01/29/21   Sherel Dikes, PA-C  ondansetron  (ZOFRAN ) 4 MG tablet Take 1 tablet (4 mg total) by mouth every 6 (six) hours. 07/25/23   Roemhildt, Lorin T, PA-C  promethazine -dextromethorphan (PROMETHAZINE -DM) 6.25-15 MG/5ML syrup Take 5 mLs by mouth 4 (four) times daily as needed for cough. 08/08/23   Schutt, Coni Deep, PA-C  sulfamethoxazole -trimethoprim  (BACTRIM  DS) 800-160 MG tablet Take 2 tablets by mouth 2 (two) times daily. 02/01/21   Helaine Llanos, MD      Allergies    Patient has no known allergies.    Review of Systems   Review of Systems  All other systems reviewed and are negative.   Physical Exam Updated Vital Signs BP (!) 145/87 (BP Location: Right Arm)   Pulse 98   Temp (!) 97.5 F (36.4 C)   Resp 16   SpO2 99%  Physical Exam Vitals and nursing note  reviewed.  Constitutional:      General: He is not in acute distress.    Appearance: He is well-developed. He is not diaphoretic.  HENT:     Head: Normocephalic and atraumatic.  Cardiovascular:     Rate and Rhythm: Normal rate and regular rhythm.     Heart sounds: No murmur heard.    No friction rub.  Pulmonary:     Effort: Pulmonary effort is normal. No respiratory distress.     Breath sounds: Normal breath sounds. No wheezing or rales.  Abdominal:     General: Bowel sounds are normal. There is no distension.     Palpations: Abdomen is soft.     Tenderness: There is no abdominal tenderness.  Musculoskeletal:        General: Normal range of motion.     Cervical back: Normal range of motion and neck supple.  Skin:    General: Skin is warm and dry.  Neurological:     Mental Status: He is alert and oriented to person, place, and time.     Coordination: Coordination normal.     ED Results / Procedures / Treatments   Labs (all labs ordered are listed, but only abnormal results are displayed) Labs Reviewed  CBC - Abnormal; Notable for the following components:      Result Value   WBC 12.2 (*)    All other components within normal limits  BASIC  METABOLIC PANEL WITH GFR  TROPONIN T, HIGH SENSITIVITY    EKG   Radiology DG Chest 2 View Result Date: 11/19/2023 EXAM: 2 VIEW(S) XRAY OF THE CHEST 11/18/2023 11:58:49 PM COMPARISON: 08/08/2023 CLINICAL HISTORY: Chest. Table formatting from the original note was not included. Central chest pain and pressure on and off for 3 days. Does not seem to be worse with activity. 2 baby ASA PTA. FINDINGS: LUNGS AND PLEURA: No consolidation. No pulmonary edema. No pleural effusion. No pneumothorax. HEART AND MEDIASTINUM: No acute abnormality of the cardiac and mediastinal silhouettes. BONES AND SOFT TISSUES: No acute osseous abnormality. IMPRESSION: 1. No acute process. Electronically signed by: Zadie Herter MD 11/19/2023 12:04 AM EDT RP  Workstation: [id number]    Procedures Procedures    Medications Ordered in ED Medications - No data to display  ED Course/ Medical Decision Making/ A&P  Patient is a 33 year old male presenting with chest discomfort as described in the HPI.  Patient arrives here with stable vital signs and is afebrile.  Physical examination basically unremarkable.  Laboratory studies obtained including CBC, metabolic panel, and troponin, all of which are normal.  Chest x-ray shows no acute process.  Patient with negative troponin despite 3 days of intermittent symptoms.  His symptoms are atypical for cardiac pain and only risk factor is family history with his mother having a stent placed at age 26.  At this point, I feel as though he can safely be discharged with outpatient cardiology follow-up given his family history.  To return as needed in the meantime if symptoms worsen or change.  Final Clinical Impression(s) / ED Diagnoses Final diagnoses:  None    Rx / DC Orders ED Discharge Orders     None         Orvilla Blander, MD 11/19/23 [phone]

## 2024-01-03 DIAGNOSIS — R072 Precordial pain: Secondary | ICD-10-CM | POA: Insufficient documentation

## 2024-01-03 NOTE — Progress Notes (Deleted)
 Cardiology Office Note   Date:  01/03/2024   ID:  DANIEL RITTHALER, DOB July 18, 1991, MRN [id number]  PCP:  Carollynn Cirri, NP  Cardiologist:   None Referring:  ***  No chief complaint on file.     History of Present Illness: Andrew Ray is a 33 y.o. male who presents for evaluation of chest pain.  ***      Past Medical History:  Diagnosis Date   History of chicken pox    Kidney stone     Past Surgical History:  Procedure Laterality Date   SMALL INTESTINE SURGERY     as a baby     Current Outpatient Medications  Medication Sig Dispense Refill   chlorhexidine (HIBICLENS ) 4 % external liquid Apply topically daily as needed. 120 mL 0   ondansetron  (ZOFRAN ) 4 MG tablet Take 1 tablet (4 mg total) by mouth every 6 (six) hours. 12 tablet 0   promethazine -dextromethorphan (PROMETHAZINE -DM) 6.25-15 MG/5ML syrup Take 5 mLs by mouth 4 (four) times daily as needed for cough. 118 mL 0   sulfamethoxazole -trimethoprim  (BACTRIM  DS) 800-160 MG tablet Take 2 tablets by mouth 2 (two) times daily. 30 tablet 0   No current facility-administered medications for this visit.    Allergies:   Patient has no known allergies.    Social History:  The patient  reports that he has never smoked. He has never used smokeless tobacco. He reports that he does not drink alcohol and does not use drugs.   Family History:  The patient's ***family history includes Diabetes in his maternal grandmother and mother; Hypertension in his mother.    ROS:  Please see the history of present illness.   Otherwise, review of systems are positive for {NONE DEFAULTED:18576}.   All other systems are reviewed and negative.    PHYSICAL EXAM: VS:  There were no vitals taken for this visit. , BMI There is no height or weight on file to calculate BMI. GENERAL:  Well appearing HEENT:  Pupils equal round and reactive, fundi not visualized, oral mucosa unremarkable NECK:  No jugular venous distention,  waveform within normal limits, carotid upstroke brisk and symmetric, no bruits, no thyromegaly LYMPHATICS:  No cervical, inguinal adenopathy LUNGS:  Clear to auscultation bilaterally BACK:  No CVA tenderness CHEST:  Unremarkable HEART:  PMI not displaced or sustained,S1 and S2 within normal limits, no S3, no S4, no clicks, no rubs, *** murmurs ABD:  Flat, positive bowel sounds normal in frequency in pitch, no bruits, no rebound, no guarding, no midline pulsatile mass, no hepatomegaly, no splenomegaly EXT:  2 plus pulses throughout, no edema, no cyanosis no clubbing SKIN:  No rashes no nodules NEURO:  Cranial nerves II through XII grossly intact, motor grossly intact throughout PSYCH:  Cognitively intact, oriented to person place and time    EKG:        Recent Labs: 11/19/2023: BUN 9; Creatinine, Ser 1.01; Hemoglobin 14.1; Platelets 304; Potassium 3.4; Sodium 140    Lipid Panel    Component Value Date/Time   CHOL 162 07/29/2016 1018   TRIG 101.0 07/29/2016 1018   HDL 42.70 07/29/2016 1018   CHOLHDL 4 07/29/2016 1018   VLDL 20.2 07/29/2016 1018   LDLCALC 99 07/29/2016 1018      Wt Readings from Last 3 Encounters:  08/08/23 289 lb 0.4 oz (131.1 kg)  07/25/23 289 lb 0.4 oz (131.1 kg)  02/01/21 289 lb (131.1 kg)      Other studies Reviewed:  Additional studies/ records that were reviewed today include: ***. Review of the above records demonstrates:  Please see elsewhere in the note.  ***   ASSESSMENT AND PLAN:  ***   Current medicines are reviewed at length with the patient today.  The patient {ACTIONS; HAS/DOES NOT HAVE:19233} concerns regarding medicines.  The following changes have been made:  {PLAN; NO CHANGE:13088:s}  Labs/ tests ordered today include: *** No orders of the defined types were placed in this encounter.    Disposition:   FU with ***    Signed, Eilleen Grates, MD  01/03/2024 10:20 AM    [facility]

## 2024-01-05 ENCOUNTER — Ambulatory Visit: Payer: Self-pay | Attending: Cardiology | Admitting: Cardiology

## 2024-01-05 DIAGNOSIS — R072 Precordial pain: Secondary | ICD-10-CM

## 2024-01-06 ENCOUNTER — Encounter: Payer: Self-pay | Admitting: Cardiology

## 2024-04-21 NOTE — Progress Notes (Deleted)
     Fronie Laura, NP Reason for referral-chest pain  HPI: 33 year old male for evaluation of chest pain at request of Angeline Laura, NP.  Patient seen in the emergency room November 18, 2023 with chest pain.  Chest x-ray showed no infiltrate.  Troponin normal.  Cardiology now asked to evaluate.  Current Outpatient Medications  Medication Sig Dispense Refill   chlorhexidine (HIBICLENS ) 4 % external liquid Apply topically daily as needed. 120 mL 0   ondansetron  (ZOFRAN ) 4 MG tablet Take 1 tablet (4 mg total) by mouth every 6 (six) hours. 12 tablet 0   promethazine -dextromethorphan (PROMETHAZINE -DM) 6.25-15 MG/5ML syrup Take 5 mLs by mouth 4 (four) times daily as needed for cough. 118 mL 0   sulfamethoxazole -trimethoprim  (BACTRIM  DS) 800-160 MG tablet Take 2 tablets by mouth 2 (two) times daily. 30 tablet 0   No current facility-administered medications for this visit.    No Known Allergies   Past Medical History:  Diagnosis Date   History of chicken pox    Kidney stone     Past Surgical History:  Procedure Laterality Date   SMALL INTESTINE SURGERY     as a baby    Social History   Socioeconomic History   Marital status: Married    Spouse name: Not on file   Number of children: Not on file   Years of education: Not on file   Highest education level: Not on file  Occupational History   Not on file  Tobacco Use   Smoking status: Never   Smokeless tobacco: Never  Substance and Sexual Activity   Alcohol use: No   Drug use: No   Sexual activity: Yes  Other Topics Concern   Not on file  Social History Narrative   Not on file   Social Drivers of Health   Financial Resource Strain: Not on file  Food Insecurity: Not on file  Transportation Needs: Not on file  Physical Activity: Not on file  Stress: Not on file  Social Connections: Not on file  Intimate Partner Violence: Not on file    Family History  Problem Relation Age of Onset   Diabetes Mother     Hypertension Mother    Diabetes Maternal Grandmother    Cancer Neg Hx    Heart disease Neg Hx    Stroke Neg Hx     ROS: no fevers or chills, productive cough, hemoptysis, dysphasia, odynophagia, melena, hematochezia, dysuria, hematuria, rash, seizure activity, orthopnea, PND, pedal edema, claudication. Remaining systems are negative.  Physical Exam:   There were no vitals taken for this visit.  General:  Well developed/well nourished in NAD Skin warm/dry Patient not depressed No peripheral clubbing Back-normal HEENT-normal/normal eyelids Neck supple/normal carotid upstroke bilaterally; no bruits; no JVD; no thyromegaly chest - CTA/ normal expansion CV - RRR/normal S1 and S2; no murmurs, rubs or gallops;  PMI nondisplaced Abdomen -NT/ND, no HSM, no mass, + bowel sounds, no bruit 2+ femoral pulses, no bruits Ext-no edema, chords, 2+ DP Neuro-grossly nonfocal  ECG -November 18, 2023-normal sinus rhythm, nonspecific ST changes.  Personally reviewed  A/P  1 chest pain-  Redell Shallow, MD

## 2024-05-05 ENCOUNTER — Ambulatory Visit: Payer: Self-pay | Attending: Cardiology | Admitting: Cardiology
